# Patient Record
Sex: Female | Born: 1935 | Race: Black or African American | Hispanic: No | Smoking: Never smoker
Health system: Southern US, Community
[De-identification: ages and names within clinical notes are randomized; demographics above are authoritative.]

## PROBLEM LIST (undated history)

## (undated) DIAGNOSIS — R413 Other amnesia: Secondary | ICD-10-CM

## (undated) DIAGNOSIS — S025XXA Fracture of tooth (traumatic), initial encounter for closed fracture: Secondary | ICD-10-CM

## (undated) DIAGNOSIS — R0989 Other specified symptoms and signs involving the circulatory and respiratory systems: Secondary | ICD-10-CM

## (undated) DIAGNOSIS — N289 Disorder of kidney and ureter, unspecified: Secondary | ICD-10-CM

## (undated) DIAGNOSIS — E119 Type 2 diabetes mellitus without complications: Secondary | ICD-10-CM

## (undated) DIAGNOSIS — I219 Acute myocardial infarction, unspecified: Secondary | ICD-10-CM

## (undated) DIAGNOSIS — I10 Essential (primary) hypertension: Secondary | ICD-10-CM

## (undated) DIAGNOSIS — E7849 Other hyperlipidemia: Secondary | ICD-10-CM

## (undated) HISTORY — PX: NEPHRECTOMY: SHX65

---

## 2001-12-02 ENCOUNTER — Encounter: Payer: Self-pay | Admitting: Internal Medicine

## 2001-12-02 LAB — CONVERTED CEMR LAB

## 2005-09-06 ENCOUNTER — Emergency Department (HOSPITAL_COMMUNITY): Admission: EM | Admit: 2005-09-06 | Discharge: 2005-09-06 | Payer: Self-pay | Admitting: Family Medicine

## 2005-12-11 ENCOUNTER — Ambulatory Visit: Payer: Self-pay | Admitting: Internal Medicine

## 2006-02-03 ENCOUNTER — Emergency Department (HOSPITAL_COMMUNITY): Admission: EM | Admit: 2006-02-03 | Discharge: 2006-02-03 | Payer: Self-pay | Admitting: Family Medicine

## 2006-05-24 ENCOUNTER — Emergency Department (HOSPITAL_COMMUNITY): Admission: EM | Admit: 2006-05-24 | Discharge: 2006-05-24 | Payer: Self-pay | Admitting: Family Medicine

## 2006-08-12 ENCOUNTER — Ambulatory Visit: Payer: Self-pay | Admitting: Internal Medicine

## 2006-09-05 ENCOUNTER — Emergency Department (HOSPITAL_COMMUNITY): Admission: EM | Admit: 2006-09-05 | Discharge: 2006-09-05 | Payer: Self-pay | Admitting: Family Medicine

## 2006-11-07 ENCOUNTER — Emergency Department (HOSPITAL_COMMUNITY): Admission: EM | Admit: 2006-11-07 | Discharge: 2006-11-07 | Payer: Self-pay | Admitting: Family Medicine

## 2007-03-13 ENCOUNTER — Emergency Department (HOSPITAL_COMMUNITY): Admission: EM | Admit: 2007-03-13 | Discharge: 2007-03-13 | Payer: Self-pay | Admitting: Family Medicine

## 2007-03-15 ENCOUNTER — Emergency Department (HOSPITAL_COMMUNITY): Admission: EM | Admit: 2007-03-15 | Discharge: 2007-03-16 | Payer: Self-pay | Admitting: Emergency Medicine

## 2007-04-21 ENCOUNTER — Emergency Department (HOSPITAL_COMMUNITY): Admission: EM | Admit: 2007-04-21 | Discharge: 2007-04-21 | Payer: Self-pay | Admitting: Family Medicine

## 2007-04-23 ENCOUNTER — Ambulatory Visit: Payer: Self-pay | Admitting: Internal Medicine

## 2007-05-22 ENCOUNTER — Encounter: Payer: Self-pay | Admitting: Internal Medicine

## 2007-05-22 DIAGNOSIS — E119 Type 2 diabetes mellitus without complications: Secondary | ICD-10-CM

## 2007-05-22 DIAGNOSIS — I10 Essential (primary) hypertension: Secondary | ICD-10-CM

## 2007-05-22 DIAGNOSIS — Q6689 Other  specified congenital deformities of feet: Secondary | ICD-10-CM | POA: Insufficient documentation

## 2007-07-03 ENCOUNTER — Encounter: Payer: Self-pay | Admitting: Internal Medicine

## 2007-11-11 ENCOUNTER — Encounter: Payer: Self-pay | Admitting: Internal Medicine

## 2007-12-29 ENCOUNTER — Emergency Department (HOSPITAL_COMMUNITY): Admission: EM | Admit: 2007-12-29 | Discharge: 2007-12-29 | Payer: Self-pay | Admitting: Family Medicine

## 2008-03-06 ENCOUNTER — Emergency Department (HOSPITAL_COMMUNITY): Admission: EM | Admit: 2008-03-06 | Discharge: 2008-03-06 | Payer: Self-pay | Admitting: Family Medicine

## 2008-04-21 ENCOUNTER — Encounter: Payer: Self-pay | Admitting: Internal Medicine

## 2008-04-21 ENCOUNTER — Telehealth: Payer: Self-pay | Admitting: *Deleted

## 2008-07-02 ENCOUNTER — Emergency Department (HOSPITAL_COMMUNITY): Admission: EM | Admit: 2008-07-02 | Discharge: 2008-07-02 | Payer: Self-pay | Admitting: Family Medicine

## 2008-08-07 ENCOUNTER — Emergency Department (HOSPITAL_COMMUNITY): Admission: EM | Admit: 2008-08-07 | Discharge: 2008-08-08 | Payer: Self-pay | Admitting: Emergency Medicine

## 2010-08-27 ENCOUNTER — Emergency Department (HOSPITAL_COMMUNITY): Admission: EM | Admit: 2010-08-27 | Discharge: 2010-08-27 | Payer: Self-pay | Admitting: Emergency Medicine

## 2011-02-14 LAB — POCT I-STAT, CHEM 8
Chloride: 106 mEq/L (ref 96–112)
Creatinine, Ser: 1.2 mg/dL (ref 0.4–1.2)
HCT: 33 % — ABNORMAL LOW (ref 36.0–46.0)
Hemoglobin: 11.2 g/dL — ABNORMAL LOW (ref 12.0–15.0)
Potassium: 3.9 mEq/L (ref 3.5–5.1)
Sodium: 140 mEq/L (ref 135–145)

## 2011-02-14 LAB — URINALYSIS, ROUTINE W REFLEX MICROSCOPIC
Hgb urine dipstick: NEGATIVE
Specific Gravity, Urine: 1.012 (ref 1.005–1.030)
Urobilinogen, UA: 1 mg/dL (ref 0.0–1.0)
pH: 5 (ref 5.0–8.0)

## 2016-01-19 ENCOUNTER — Emergency Department (HOSPITAL_COMMUNITY)
Admission: EM | Admit: 2016-01-19 | Discharge: 2016-01-19 | Disposition: A | Discharge status: Home / Self Care | Payer: Medicare PPO | Attending: Emergency Medicine | Admitting: Emergency Medicine

## 2016-01-19 ENCOUNTER — Encounter (HOSPITAL_COMMUNITY): Payer: Self-pay | Admitting: Emergency Medicine

## 2016-01-19 ENCOUNTER — Emergency Department (HOSPITAL_COMMUNITY): Payer: Medicare PPO

## 2016-01-19 DIAGNOSIS — R079 Chest pain, unspecified: Secondary | ICD-10-CM | POA: Diagnosis present

## 2016-01-19 DIAGNOSIS — Z79899 Other long term (current) drug therapy: Secondary | ICD-10-CM | POA: Diagnosis not present

## 2016-01-19 DIAGNOSIS — Z880 Allergy status to penicillin: Secondary | ICD-10-CM | POA: Insufficient documentation

## 2016-01-19 DIAGNOSIS — I10 Essential (primary) hypertension: Secondary | ICD-10-CM | POA: Insufficient documentation

## 2016-01-19 DIAGNOSIS — R0789 Other chest pain: Secondary | ICD-10-CM | POA: Insufficient documentation

## 2016-01-19 DIAGNOSIS — I252 Old myocardial infarction: Secondary | ICD-10-CM | POA: Insufficient documentation

## 2016-01-19 DIAGNOSIS — E119 Type 2 diabetes mellitus without complications: Secondary | ICD-10-CM | POA: Diagnosis not present

## 2016-01-19 HISTORY — DX: Type 2 diabetes mellitus without complications: E11.9

## 2016-01-19 HISTORY — DX: Acute myocardial infarction, unspecified: I21.9

## 2016-01-19 HISTORY — DX: Disorder of kidney and ureter, unspecified: N28.9

## 2016-01-19 HISTORY — DX: Essential (primary) hypertension: I10

## 2016-01-19 LAB — BASIC METABOLIC PANEL
Anion gap: 12 (ref 5–15)
BUN: 30 mg/dL — ABNORMAL HIGH (ref 6–20)
CALCIUM: 9.2 mg/dL (ref 8.9–10.3)
CHLORIDE: 103 mmol/L (ref 101–111)
CO2: 24 mmol/L (ref 22–32)
CREATININE: 1.34 mg/dL — AB (ref 0.44–1.00)
GFR, EST AFRICAN AMERICAN: 43 mL/min — AB (ref >60)
GFR, EST NON AFRICAN AMERICAN: 37 mL/min — AB (ref >60)
Glucose, Bld: 142 mg/dL — ABNORMAL HIGH (ref 65–99)
Potassium: 3.7 mmol/L (ref 3.5–5.1)
SODIUM: 139 mmol/L (ref 135–145)

## 2016-01-19 LAB — CBC
HCT: 30.7 % — ABNORMAL LOW (ref 36.0–46.0)
Hemoglobin: 10.1 g/dL — ABNORMAL LOW (ref 12.0–15.0)
MCH: 25 pg — ABNORMAL LOW (ref 26.0–34.0)
MCHC: 32.9 g/dL (ref 30.0–36.0)
MCV: 76 fL — ABNORMAL LOW (ref 78.0–100.0)
PLATELETS: 195 10*3/uL (ref 150–400)
RBC: 4.04 MIL/uL (ref 3.87–5.11)
RDW: 15.6 % — AB (ref 11.5–15.5)
WBC: 6.3 10*3/uL (ref 4.0–10.5)

## 2016-01-19 LAB — I-STAT TROPONIN, ED
TROPONIN I, POC: 0 ng/mL (ref 0.00–0.08)
TROPONIN I, POC: 0 ng/mL (ref 0.00–0.08)

## 2016-01-19 LAB — D-DIMER, QUANTITATIVE (NOT AT ARMC): D DIMER QUANT: 3.29 ug{FEU}/mL — AB (ref 0.00–0.50)

## 2016-01-19 MED ORDER — TRAMADOL HCL 50 MG PO TABS: 50.0000 mg | ORAL_TABLET | Freq: Four times a day (QID) | ORAL | Status: DC | PRN

## 2016-01-19 MED ORDER — IOHEXOL 350 MG/ML SOLN
80.0000 mL | Freq: Once | INTRAVENOUS | Status: AC | PRN
  Administered 2016-01-19: 80 mL via INTRAVENOUS

## 2016-01-19 NOTE — ED Provider Notes (Signed)
CSN: [id number]     Arrival date & time 01/19/16  1136 History   First MD Initiated Contact with Patient 01/19/16 1517     Chief Complaint  Patient presents with  . Chest Pain     (Consider location/radiation/quality/duration/timing/severity/associated sxs/prior Treatment) HPI Dawn Curtis is a 80 y.o. female with history of hypertension, diabetes, MI with 2 stents, presents to emergency department complaining of chest pain and jaw pain. Pt states symptoms started 3 days ago. Reports she symptoms began with some jaw and gum pain which she initially attributed to recent bridge fitting. States pain continued and is intermittent, states not exertional or associated with eating. States pain has then moved into her chest. States pain is intermittent in her chest as well but it is also pleuritic. Denies SOB at rest or on exertion. Stats this feels different from her prior MI. Denies LE pain or swelling. Stats she did have some ear fullness and voice horseness as  that started around the same time. Denies any fever or chills. Denies any other complaints. 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   Past Medical History  Diagnosis Date  . Hypertension   . Diabetes mellitus without complication (HCC)   . MI (myocardial infarction) [facility])    Past Surgical History  Procedure Laterality Date  . Nephrectomy Left    No family history on file. Social History  Substance Use Topics  . Smoking status: Never Smoker   . Smokeless tobacco: None  . Alcohol Use: No   OB History    No data available     Review of Systems  Constitutional: Negative for fever and chills.  HENT: Negative for congestion and sore throat.   Respiratory: Positive for chest tightness. Negative for cough, shortness of breath and wheezing.   Cardiovascular: Positive for chest pain. Negative for palpitations and leg swelling.  Gastrointestinal: Negative for nausea,  vomiting, abdominal pain and diarrhea.  Genitourinary: Negative for dysuria, flank pain and pelvic pain.  Musculoskeletal: Positive for myalgias and arthralgias. Negative for neck pain and neck stiffness.  Skin: Negative for rash.  Neurological: Negative for dizziness, weakness and headaches.  All other systems reviewed and are negative.     Allergies  Amoxicillin and Penicillins  Home Medications   Prior to Admission medications   Medication Sig Start Date End Date Taking? Authorizing Provider  Ascorbic Acid (VITAMIN C) 1000 MG tablet Take 1,000 mg by mouth daily.   Yes Historical Provider, MD  cyanocobalamin 500 MCG tablet Take 1,000 mcg by mouth daily.   Yes Historical Provider, MD  GARLIC PO Take 3 capsules by mouth daily.   Yes Historical Provider, MD  glipiZIDE (GLUCOTROL XL) 5 MG 24 hr tablet Take 5 mg by mouth daily with breakfast.   Yes Historical Provider, MD  lisinopril-hydrochlorothiazide (PRINZIDE,ZESTORETIC) 10-12.5 MG tablet Take 1 tablet by mouth daily.   Yes Historical Provider, MD   BP 130/57 mmHg  Pulse 79  Temp(Src) 97.9 F (36.6 C)  Resp 16  SpO2 100% Physical Exam  Constitutional: She is oriented to person, place, and time. She appears well-developed and well-nourished. No distress.  HENT:  Head: Normocephalic.  Eyes: Conjunctivae are normal.  Neck: Neck supple.  Cardiovascular: Normal rate, regular rhythm and normal heart sounds.   Pulmonary/Chest: Effort normal and breath sounds normal. No respiratory distress. She has no wheezes. She has no rales. She exhibits tenderness.  Right  lower anterior chest wall tenderness with palpation  Abdominal: Soft. Bowel sounds are normal. She exhibits no distension. There is no tenderness. There is no rebound.  Musculoskeletal: She exhibits no edema.  Neurological: She is alert and oriented to person, place, and time.  Skin: Skin is warm and dry.  Psychiatric: She has a normal mood and affect. Her behavior is normal.   Nursing note and vitals reviewed.   ED Course  Procedures (including critical care time) Labs Review Labs Reviewed  BASIC METABOLIC PANEL - Abnormal; Notable for the following:    Glucose, Bld 142 (*)    BUN 30 (*)    Creatinine, Ser 1.34 (*)    GFR calc non Af Amer 37 (*)    GFR calc Af Amer 43 (*)    All other components within normal limits  CBC - Abnormal; Notable for the following:    Hemoglobin 10.1 (*)    HCT 30.7 (*)    MCV 76.0 (*)    MCH 25.0 (*)    RDW 15.6 (*)    All other components within normal limits  D-DIMER, QUANTITATIVE (NOT AT [facility]) - Abnormal; Notable for the following:    D-Dimer, Quant 3.29 (*)    All other components within normal limits  I-STAT TROPOININ, ED  Rosezena Sensor, ED    Imaging Review Dg Chest 2 View  01/19/2016  CLINICAL DATA:  Cough. EXAM: CHEST  2 VIEW COMPARISON:  March 15, 2007. FINDINGS: The heart size and mediastinal contours are within normal limits. No pneumothorax or pleural effusion is noted. Mild levoscoliosis of lower thoracic spine is noted. Left lung is clear. Mild right basilar subsegmental atelectasis or scarring is noted. IMPRESSION: Mild right basilar subsegmental atelectasis or scarring. Electronically Signed   By: Lupita Raider, M.D.   On: 01/19/2016 12:21   Ct Angio Chest Pe W/cm &/or Wo Cm  01/19/2016  CLINICAL DATA:  Pt c/o CP X 3 days which comes and goes. EXAM: CT ANGIOGRAPHY CHEST WITH CONTRAST TECHNIQUE: Multidetector CT imaging of the chest was performed using the standard protocol during bolus administration of intravenous contrast. Multiplanar CT image reconstructions and MIPs were obtained to evaluate the vascular anatomy. CONTRAST:  75 mL OMNIPAQUE IOHEXOL 350 MG/ML SOLN COMPARISON:  Current chest radiograph. FINDINGS: Angiographic study: No evidence of a pulmonary embolus. Aorta is normal in caliber. No dissection. Neck base and axilla:  No adenopathy.  No masses.  Normal thyroid. Mediastinum and hila: Heart  normal in size. Moderate coronary artery calcifications. No mediastinal or hilar masses or adenopathy. Lungs and pleura: There is bronchial wall thickening to the lower lobes and right middle lobe. There is mild associated hazy ground-glass type opacity noted right middle lobe, both lower lobes and lingula of the left upper lobe. There also linear and reticular opacities are likely. A 5 mm irregular nodule lies right upper lobe, image 70, series 5. 3 mm nodule lies laterally in the left lower lobe, image 16. There is no evidence of pulmonary edema. No pleural effusion or pneumothorax. Limited upper abdomen:  No acute finding. Musculoskeletal: A mild loss of disc height and degenerative spurring noted throughout thoracic spine. No osteoblastic or osteolytic lesions. Review of the MIP images confirms the above findings. IMPRESSION: 1. No evidence a pulmonary embolus. 2. Bronchial wall thickening associated peribronchovascular ground-glass opacity well as linear and reticular opacity, noted in the lower lungs, mostly in the lower lobes. Findings may be chronic due chronic bronchitic change and atelectasis. Acute  bronchial infection/inflammation should be considered proper clinical setting. No evidence lobar pneumonia or pulmonary edema. 3. Small lung nodules. If the patient is at high risk for bronchogenic carcinoma, follow-up chest CT at 6-12 months is recommended. If the patient is at low risk for bronchogenic carcinoma, follow-up chest CT at 12 months is recommended. This recommendation follows the consensus statement: Guidelines for Management of Small Pulmonary Nodules Detected on CT Scans: A Statement from the Fleischner Society as published in Radiology 2005;237:395-400. Electronically Signed   By: Amie Portland M.D.   On: 01/19/2016 18:16   I have personally reviewed and evaluated these images and lab results as part of my medical decision-making.   EKG Interpretation   Date/Time:  Friday January 19 2016 11:48:18 EST Ventricular Rate:  76 PR Interval:  126 QRS Duration: 94 QT Interval:  368 QTC Calculation: 414 R Axis:   78 Text Interpretation:  Normal sinus rhythm Nonspecific T wave abnormality  Abnormal ECG No significant change since last tracing Confirmed by KNAPP   MD-J, JON ([id number]) on 01/19/2016 4:28:39 PM      MDM   Final diagnoses:  Chest pain, unspecified chest pain type    Patient with right-sided chest pain that is pleuritic and reproducible with palpation. Patient does have history of MI, 2-1/2 years ago, with stenting but states this feels very different. Her pain is nonexertional, she denies any shortness of breath, denies any swelling in extremities. Will get labs including troponin, d-dimer, vital signs are normal at this time, patient is pain-free unless she takes a deep breath or pushes on her chest.  D-dimer elevated at 3.29. CT angiogram is ordered.  6:29 PM Pt's two sets of troponin are 0.00. ECG with no acute changes. Doubt ACS. CT imaging shows no evidence of pulmonary embolism. He does show bronchial wall thickening with peribronchial vascular ground glass opacity, suggestive of possible chronic bronchitis and atelectasis. I do not think these findings are acute, patient is afebrile, she denies any productive cough. Also small lung nodules noted. Discussed these results with patient. Will treat pain with Tylenol and tramadol, follow-up with primary care doctor for recheck of further evaluation. Return precautions discussed  Filed Vitals:   01/19/16 1149 01/19/16 1548 01/19/16 1600 01/19/16 1630  BP: 136/70 130/57 134/75 121/57  Pulse: 76 79 70 70  Temp: 97.9 F (36.6 C)     Resp: 18 16 14 23   SpO2: 100% 100% 97% 98%        Jaynie Crumble, PA-C 01/19/16 1832  Linwood Dibbles, MD 01/20/16 1616

## 2016-01-19 NOTE — ED Notes (Signed)
Patient transported to CT 

## 2016-01-19 NOTE — ED Notes (Signed)
Pt reports CP x 3 days which comes and goes. Pt alert x4. NAD at this time.

## 2016-01-19 NOTE — Discharge Instructions (Signed)
Take tylenol for pain. Tramadol for severe pain. Your blood work and imaging results did not show any evidence of blood clots or heart attack. Your CT scan did show few nodules and scarring in your lungs, please follow up with primary care doctor for recheck. Return if worsening symptoms.    Chest Wall Pain Chest wall pain is pain in or around the bones and muscles of your chest. Sometimes, an injury causes this pain. Sometimes, the cause may not be known. This pain may take several weeks or longer to get better. HOME CARE INSTRUCTIONS  Pay attention to any changes in your symptoms. Take these actions to help with your pain:   Rest as told by your health care provider.   Avoid activities that cause pain. These include any activities that use your chest muscles or your abdominal and side muscles to lift heavy items.   If directed, apply ice to the painful area:  Put ice in a plastic bag.  Place a towel between your skin and the bag.  Leave the ice on for 20 minutes, 2-3 times per day.  Take over-the-counter and prescription medicines only as told by your health care provider.  Do not use tobacco products, including cigarettes, chewing tobacco, and e-cigarettes. If you need help quitting, ask your health care provider.  Keep all follow-up visits as told by your health care provider. This is important. SEEK MEDICAL CARE IF:  You have a fever.  Your chest pain becomes worse.  You have new symptoms. SEEK IMMEDIATE MEDICAL CARE IF:  You have nausea or vomiting.  You feel sweaty or light-headed.  You have a cough with phlegm (sputum) or you cough up blood.  You develop shortness of breath.   This information is not intended to replace advice given to you by your health care provider. Make sure you discuss any questions you have with your health care provider.   Document Released: 11/18/2005 Document Revised: 08/09/2015 Document Reviewed: 02/13/2015 Elsevier Interactive  Patient Education [employer]

## 2016-02-28 ENCOUNTER — Encounter (HOSPITAL_COMMUNITY): Payer: Self-pay | Admitting: *Deleted

## 2016-02-28 ENCOUNTER — Emergency Department (INDEPENDENT_AMBULATORY_CARE_PROVIDER_SITE_OTHER)
Admission: EM | Admit: 2016-02-28 | Discharge: 2016-02-28 | Disposition: A | Discharge status: Home / Self Care | Payer: Medicare PPO | Source: Home / Self Care | Attending: Family Medicine | Admitting: Family Medicine

## 2016-02-28 ENCOUNTER — Encounter (HOSPITAL_COMMUNITY): Payer: Self-pay | Admitting: Emergency Medicine

## 2016-02-28 ENCOUNTER — Emergency Department (HOSPITAL_COMMUNITY)
Admission: EM | Admit: 2016-02-28 | Discharge: 2016-02-28 | Disposition: A | Discharge status: Home / Self Care | Payer: Medicare PPO | Attending: Emergency Medicine | Admitting: Emergency Medicine

## 2016-02-28 DIAGNOSIS — I10 Essential (primary) hypertension: Secondary | ICD-10-CM | POA: Insufficient documentation

## 2016-02-28 DIAGNOSIS — H538 Other visual disturbances: Secondary | ICD-10-CM | POA: Diagnosis not present

## 2016-02-28 DIAGNOSIS — H5711 Ocular pain, right eye: Secondary | ICD-10-CM | POA: Insufficient documentation

## 2016-02-28 DIAGNOSIS — E119 Type 2 diabetes mellitus without complications: Secondary | ICD-10-CM | POA: Diagnosis not present

## 2016-02-28 DIAGNOSIS — I252 Old myocardial infarction: Secondary | ICD-10-CM | POA: Insufficient documentation

## 2016-02-28 NOTE — ED Notes (Signed)
Visual  Acuity      20/100   r     20/100  l     20-/100  Both

## 2016-02-28 NOTE — ED Provider Notes (Signed)
CSN: [id number]     Arrival date & time 02/28/16  1551 History   First MD Initiated Contact with Patient 02/28/16 1715     Chief Complaint  Patient presents with  . Eye Problem   (Consider location/radiation/quality/duration/timing/severity/associated sxs/prior Treatment) Patient is a 80 y.o. female presenting with eye problem. The history is provided by the patient.  Eye Problem Location:  R eye Quality:  Tearing Severity:  Moderate Onset quality:  Gradual Duration:  1 week Progression:  Worsening Chronicity:  New Context comment:  Pain and decreased vision right eye onset today. Associated symptoms: redness     Past Medical History  Diagnosis Date  . Hypertension   . Diabetes mellitus without complication (HCC)   . MI (myocardial infarction) (HCC)   . Renal insufficiency    Past Surgical History  Procedure Laterality Date  . Nephrectomy Left    History reviewed. No pertinent family history. Social History  Substance Use Topics  . Smoking status: Never Smoker   . Smokeless tobacco: None  . Alcohol Use: No   OB History    No data available     Review of Systems  Constitutional: Negative.   Eyes: Positive for pain, redness and visual disturbance.  All other systems reviewed and are negative.   Allergies  Amoxicillin and Penicillins  Home Medications   Prior to Admission medications   Medication Sig Start Date End Date Taking? Authorizing Provider  Ascorbic Acid (VITAMIN C) 1000 MG tablet Take 1,000 mg by mouth daily.    Historical Provider, MD  cyanocobalamin 500 MCG tablet Take 1,000 mcg by mouth daily.    Historical Provider, MD  GARLIC PO Take 3 capsules by mouth daily.    Historical Provider, MD  glipiZIDE (GLUCOTROL XL) 5 MG 24 hr tablet Take 5 mg by mouth daily with breakfast.    Historical Provider, MD  lisinopril-hydrochlorothiazide (PRINZIDE,ZESTORETIC) 10-12.5 MG tablet Take 1 tablet by mouth daily.    Historical Provider, MD  traMADol (ULTRAM) 50  MG tablet Take 1 tablet (50 mg total) by mouth every 6 (six) hours as needed. 01/19/16   Tatyana Kirichenko, PA-C   Meds Ordered and Administered this Visit  Medications - No data to display  BP 133/52 mmHg  Pulse 58  Temp(Src) 98.1 F (36.7 C) (Oral)  Resp 18  SpO2 100% No data found.   Physical Exam  Constitutional: She appears well-developed and well-nourished. She appears distressed.  Eyes: EOM are normal. Pupils are equal, round, and reactive to light. Right eye exhibits no discharge. Left eye exhibits no discharge. Right conjunctiva has no hemorrhage. Left conjunctiva has no hemorrhage.    Nursing note and vitals reviewed.   ED Course  Procedures (including critical care time)  Labs Review Labs Reviewed - No data to display  Imaging Review No results found.   Visual Acuity Review  Right Eye Distance:   Left Eye Distance:   Bilateral Distance:    Right Eye Near:   Left Eye Near:    Bilateral Near:         MDM   1. Eye pain, right        Linna HoffJames D Alayjah Boehringer, MD 02/28/16 [phone]

## 2016-02-28 NOTE — ED Notes (Signed)
Pt sts right eye pain and blurred vision upon waking this am

## 2016-02-28 NOTE — ED Notes (Signed)
Pt  Had  Eye      Had  Tearing     And  Irritation   X  1  Week     -  Pt  Has  Pain     In the  Affected  Today  The  Eye  Is  Red        r  Eye  Feels gritty

## 2016-02-28 NOTE — Discharge Instructions (Signed)
Call eye doctor in am for further eval. Report that we have spoken with doctor groat for you to be seen.

## 2016-05-22 ENCOUNTER — Ambulatory Visit (HOSPITAL_COMMUNITY)
Admission: EM | Admit: 2016-05-22 | Discharge: 2016-05-22 | Disposition: A | Discharge status: Home / Self Care | Payer: Medicare PPO | Attending: Emergency Medicine | Admitting: Emergency Medicine

## 2016-05-22 ENCOUNTER — Encounter (HOSPITAL_COMMUNITY): Payer: Self-pay | Admitting: Emergency Medicine

## 2016-05-22 DIAGNOSIS — H6122 Impacted cerumen, left ear: Secondary | ICD-10-CM

## 2016-05-22 DIAGNOSIS — D649 Anemia, unspecified: Secondary | ICD-10-CM

## 2016-05-22 DIAGNOSIS — I10 Essential (primary) hypertension: Secondary | ICD-10-CM | POA: Diagnosis not present

## 2016-05-22 DIAGNOSIS — J040 Acute laryngitis: Secondary | ICD-10-CM

## 2016-05-22 DIAGNOSIS — B9789 Other viral agents as the cause of diseases classified elsewhere: Secondary | ICD-10-CM

## 2016-05-22 LAB — POCT I-STAT, CHEM 8
BUN: 25 mg/dL — ABNORMAL HIGH (ref 6–20)
CHLORIDE: 103 mmol/L (ref 101–111)
Calcium, Ion: 1.14 mmol/L (ref 1.13–1.30)
Creatinine, Ser: 1.4 mg/dL — ABNORMAL HIGH (ref 0.44–1.00)
Glucose, Bld: 92 mg/dL (ref 65–99)
HCT: 28 % — ABNORMAL LOW (ref 36.0–46.0)
Hemoglobin: 9.5 g/dL — ABNORMAL LOW (ref 12.0–15.0)
POTASSIUM: 3.7 mmol/L (ref 3.5–5.1)
SODIUM: 139 mmol/L (ref 135–145)
TCO2: 24 mmol/L (ref 0–100)

## 2016-05-22 MED ORDER — AMLODIPINE BESYLATE 5 MG PO TABS: 5.0000 mg | ORAL_TABLET | Freq: Every day | ORAL | Status: DC

## 2016-05-22 MED ORDER — PREDNISONE 20 MG PO TABS: 20.0000 mg | ORAL_TABLET | Freq: Every day | ORAL | Status: DC

## 2016-05-22 NOTE — ED Notes (Signed)
[facility] with a complaint of bilateral ear pain with laryngitis x 5 days. The patient also requested a refill on her HTN med lisinopril.

## 2016-05-22 NOTE — ED Provider Notes (Signed)
CSN: [id number]     Arrival date & time 05/22/16  1830 History   First MD Initiated Contact with Patient 05/22/16 1921     Chief Complaint  Patient presents with  . Otalgia  . Medication Refill   (Consider location/radiation/quality/duration/timing/severity/associated sxs/prior Treatment) HPI  She is a 80 year old woman here for evaluation of ear pain and medication refill. She states she's been sick with a cold for about 5 days. She reports sneezing, runny nose, mild cough, laryngitis. These have mostly improved, but she is now experiencing bilateral ear discomfort. She reports decreased hearing in the left ear. She did get some mucus up with coughing earlier today. No documented fevers. No nausea or vomiting. She denies any shortness of breath or wheezing.  She is also requesting a refill of her lisinopril-HCTZ. She's been out of this medicine for the last 5 days. [place] has been in [place] 3 months, and has not yet established a PCP.  Past Medical History  Diagnosis Date  . Hypertension   . Diabetes mellitus without complication (HCC)   . MI (myocardial infarction) (HCC)   . Renal insufficiency    Past Surgical History  Procedure Laterality Date  . Nephrectomy Left    History reviewed. No pertinent family history. Social History  Substance Use Topics  . Smoking status: Never Smoker   . Smokeless tobacco: None  . Alcohol Use: No   OB History    No data available     Review of Systems As in history of present illness Allergies  Amoxicillin and Penicillins  Home Medications   Prior to Admission medications   Medication Sig Start Date End Date Taking? Authorizing Provider  amLODipine (NORVASC) 5 MG tablet Take 1 tablet (5 mg total) by mouth daily. 05/22/16   Charm RingsErin J Honig, MD  Ascorbic Acid (VITAMIN C) 1000 MG tablet Take 1,000 mg by mouth daily.    Historical Provider, MD  cyanocobalamin 500 MCG tablet Take 1,000 mcg by mouth daily.    Historical Provider,  MD  GARLIC PO Take 3 capsules by mouth daily.    Historical Provider, MD  glipiZIDE (GLUCOTROL XL) 5 MG 24 hr tablet Take 5 mg by mouth daily with breakfast.    Historical Provider, MD  predniSONE (DELTASONE) 20 MG tablet Take 1 tablet (20 mg total) by mouth daily with breakfast. 05/22/16   Charm RingsErin J Honig, MD  traMADol (ULTRAM) 50 MG tablet Take 1 tablet (50 mg total) by mouth every 6 (six) hours as needed. 01/19/16   Tatyana Kirichenko, PA-C   Meds Ordered and Administered this Visit  Medications - No data to display  BP 163/63 mmHg  Pulse 66  Temp(Src) 98.3 F (36.8 C) (Oral)  Resp 16  SpO2 100% No data found.   Physical Exam  Constitutional: She is oriented to person, place, and time. She appears well-developed and well-nourished. No distress.  HENT:  Mouth/Throat: Oropharynx is clear and moist. No oropharyngeal exudate.  Small amount of nasal discharge present. No erythema of the nasal mucosa, but slight swelling. Right TM is normal. Left TM obscured by earwax.  Neck: Neck supple.  Cardiovascular: Normal rate, regular rhythm and normal heart sounds.   No murmur heard. Pulmonary/Chest: Effort normal and breath sounds normal. No respiratory distress. She has no wheezes. She has no rales.  Lymphadenopathy:    She has no cervical adenopathy.  Neurological: She is alert and oriented to person, place, and time.    ED Course  Procedures (including critical  care time)  Labs Review Labs Reviewed  POCT I-STAT, CHEM 8 - Abnormal; Notable for the following:    BUN 25 (*)    Creatinine, Ser 1.40 (*)    Hemoglobin 9.5 (*)    HCT 28.0 (*)    All other components within normal limits    Imaging Review No results found.    MDM   1. Viral laryngitis   2. Essential hypertension   3. Anemia, unspecified anemia type    Left TM is normal after ear wax removal.  Short course of low-dose prednisone to help with laryngitis and ear discomfort. Given her creatinine, will change her  blood pressure medication to amlodipine 5 mg daily. I did also discuss that she has anemia. I recommended that she start an over-the-counter iron pill. She does not know if she has had a colonoscopy. She will continue to work on finding a PCP here. Her anemia does need to be worked up additionally. Follow up as needed.    Charm Rings, MD 05/22/16 2010

## 2016-05-22 NOTE — Discharge Instructions (Signed)
The ear discomfort and laryngitis are coming from a virus. Take prednisone daily for 5 days. This will help relieve the inflammation and help you feel better sooner. You can use a drop of olive oil in each ear at bedtime to help with wax buildup.  Start taking amlodipine 5 mg daily. If your blood pressures are still elevated after a week of taking this medicine, please come back.  Please get an over-the-counter iron supplement. This is typically called ferrous sulfate. Take 1 pill daily.  Keep working on finding a primary care doctor [place] in [place].

## 2016-07-01 ENCOUNTER — Emergency Department (HOSPITAL_COMMUNITY): Payer: Medicare PPO

## 2016-07-01 ENCOUNTER — Emergency Department (HOSPITAL_COMMUNITY)
Admission: EM | Admit: 2016-07-01 | Discharge: 2016-07-01 | Disposition: A | Discharge status: Home / Self Care | Payer: Medicare PPO | Attending: Emergency Medicine | Admitting: Emergency Medicine

## 2016-07-01 ENCOUNTER — Encounter (HOSPITAL_COMMUNITY): Payer: Self-pay | Admitting: Emergency Medicine

## 2016-07-01 DIAGNOSIS — Z7984 Long term (current) use of oral hypoglycemic drugs: Secondary | ICD-10-CM | POA: Insufficient documentation

## 2016-07-01 DIAGNOSIS — I10 Essential (primary) hypertension: Secondary | ICD-10-CM | POA: Diagnosis not present

## 2016-07-01 DIAGNOSIS — I252 Old myocardial infarction: Secondary | ICD-10-CM | POA: Insufficient documentation

## 2016-07-01 DIAGNOSIS — Z79899 Other long term (current) drug therapy: Secondary | ICD-10-CM | POA: Diagnosis not present

## 2016-07-01 DIAGNOSIS — R42 Dizziness and giddiness: Secondary | ICD-10-CM | POA: Diagnosis not present

## 2016-07-01 DIAGNOSIS — E119 Type 2 diabetes mellitus without complications: Secondary | ICD-10-CM | POA: Insufficient documentation

## 2016-07-01 LAB — CBC WITH DIFFERENTIAL/PLATELET
BASOS ABS: 0 10*3/uL (ref 0.0–0.1)
Basophils Relative: 0 %
Eosinophils Absolute: 0.2 10*3/uL (ref 0.0–0.7)
Eosinophils Relative: 4 %
HEMATOCRIT: 30.9 % — AB (ref 36.0–46.0)
Hemoglobin: 10.1 g/dL — ABNORMAL LOW (ref 12.0–15.0)
LYMPHS ABS: 0.9 10*3/uL (ref 0.7–4.0)
LYMPHS PCT: 17 %
MCH: 25.6 pg — AB (ref 26.0–34.0)
MCHC: 32.7 g/dL (ref 30.0–36.0)
MCV: 78.4 fL (ref 78.0–100.0)
Monocytes Absolute: 0.3 10*3/uL (ref 0.1–1.0)
Monocytes Relative: 6 %
NEUTROS ABS: 3.9 10*3/uL (ref 1.7–7.7)
Neutrophils Relative %: 73 %
Platelets: 212 10*3/uL (ref 150–400)
RBC: 3.94 MIL/uL (ref 3.87–5.11)
RDW: 14.1 % (ref 11.5–15.5)
WBC: 5.3 10*3/uL (ref 4.0–10.5)

## 2016-07-01 LAB — COMPREHENSIVE METABOLIC PANEL
ALT: 12 U/L — AB (ref 14–54)
AST: 18 U/L (ref 15–41)
Albumin: 3.6 g/dL (ref 3.5–5.0)
Alkaline Phosphatase: 92 U/L (ref 38–126)
Anion gap: 5 (ref 5–15)
BUN: 15 mg/dL (ref 6–20)
CO2: 24 mmol/L (ref 22–32)
CREATININE: 1.14 mg/dL — AB (ref 0.44–1.00)
Calcium: 9.3 mg/dL (ref 8.9–10.3)
Chloride: 108 mmol/L (ref 101–111)
GFR, EST AFRICAN AMERICAN: 52 mL/min — AB (ref >60)
GFR, EST NON AFRICAN AMERICAN: 45 mL/min — AB (ref >60)
Glucose, Bld: 118 mg/dL — ABNORMAL HIGH (ref 65–99)
Potassium: 3.6 mmol/L (ref 3.5–5.1)
Sodium: 137 mmol/L (ref 135–145)
TOTAL PROTEIN: 7.3 g/dL (ref 6.5–8.1)
Total Bilirubin: 0.7 mg/dL (ref 0.3–1.2)

## 2016-07-01 LAB — MAGNESIUM: MAGNESIUM: 1.8 mg/dL (ref 1.7–2.4)

## 2016-07-01 MED ORDER — LISINOPRIL-HYDROCHLOROTHIAZIDE 10-12.5 MG PO TABS: 1.0000 | ORAL_TABLET | Freq: Every day | ORAL | 0 refills | Status: DC

## 2016-07-01 MED ORDER — OXYCODONE-ACETAMINOPHEN 5-325 MG PO TABS
ORAL_TABLET | ORAL | Status: AC
  Filled 2016-07-01: qty 1

## 2016-07-01 NOTE — ED Notes (Signed)
Patient transported to CT 

## 2016-07-01 NOTE — ED Triage Notes (Signed)
Drove self to ED-- had an episode of dizziness started this am approx 30 minutes ago-- has had a neck pain and head pain started Saturday. H/a -- squeezing both sides of head. States bp was high last night

## 2016-07-01 NOTE — ED Notes (Signed)
Phlebotomy at bedside.

## 2016-07-01 NOTE — ED Provider Notes (Signed)
[facility] Provider Note   CSN: [id number] Arrival date & time: 07/01/16  4098  First Provider Contact:  None       History   Chief Complaint Chief Complaint  Patient presents with  . Dizziness    HPI Dawn Curtis is a 80 y.o. female.  HPI   Episode of dizziness this AM while riding in the car.  Lasted less than 1 minutes, associated with sensation of world spinning, but also describes as lightheaded. No focal weakness/numbness. No hx of similar.  For last several days has had moderate posterior headache which is new, also located bilateral temples. No falls, slow onset.  and blood pressures higher--usually 130s, now 160s-180s/80s. Feels like pressures have been higher since taking amlodipine. Was on lisinopril-hctz for years without problems, howver at urgent care was switched to amlodipine. Took 1.5 amlodipine tablets today given elevated bp.  No current weakness, no recurrence of dizziness. NO changes vision/hearing/diff speaking. No recent illness.  Feeling gen weakness/bilat leg weakness. No CP/SOB/diarrhea/vomiting/black or blood y stool.   Past Medical History:  Diagnosis Date  . Diabetes mellitus without complication (HCC)   . Hypertension   . MI (myocardial infarction) (HCC)   . Renal insufficiency     Patient Active Problem List   Diagnosis Date Noted  . DIABETES MELLITUS, TYPE II 05/22/2007  . HYPERTENSION 05/22/2007  . DEFORMITY, FOOT NEC, CONGENITAL 05/22/2007    Past Surgical History:  Procedure Laterality Date  . NEPHRECTOMY Left     OB History    No data available       Home Medications    Prior to Admission medications   Medication Sig Start Date End Date Taking? Authorizing Provider  Ascorbic Acid (VITAMIN C) 1000 MG tablet Take 1,000 mg by mouth daily.   Yes Historical Provider, MD  cyanocobalamin 500 MCG tablet Take 1,000 mcg by mouth daily.   Yes Historical Provider, MD  GARLIC PO Take 3 capsules by mouth daily.   Yes  Historical Provider, MD  L-Glutamine 500 MG CAPS Take 500 mg by mouth daily.   Yes Historical Provider, MD  glipiZIDE (GLUCOTROL XL) 5 MG 24 hr tablet Take 5 mg by mouth daily with breakfast.    Historical Provider, MD  lisinopril-hydrochlorothiazide (PRINZIDE,ZESTORETIC) 10-12.5 MG tablet Take 1 tablet by mouth daily. 07/01/16   [name], MD    Family History No family history on file.  Social History Social History  Substance Use Topics  . Smoking status: Never Smoker  . Smokeless tobacco: Never Used  . Alcohol use No     Allergies   Amoxicillin and Penicillins   Review of Systems Review of Systems  Constitutional: Negative for fever.  HENT: Negative for sore throat.   Eyes: Negative for visual disturbance.  Respiratory: Negative for cough and shortness of breath.   Cardiovascular: Negative for chest pain.  Gastrointestinal: Negative for abdominal pain.  Genitourinary: Negative for difficulty urinating.  Musculoskeletal: Negative for back pain and neck pain.  Skin: Negative for rash.  Neurological: Positive for dizziness and headaches. Negative for syncope, facial asymmetry, speech difficulty, weakness, light-headedness and numbness.     Physical Exam Updated Vital Signs BP 146/63   Pulse (!) 54   Temp 97.8 F (36.6 C) (Oral)   Resp 16   Ht 5\' 5"  (1.651 m)   Wt 133 lb 8 oz (60.6 kg)   SpO2 100%   BMI 22.22 kg/m   Physical Exam  Constitutional: She is oriented to person,  place, and time. She appears well-developed and well-nourished. No distress.  HENT:  Head: Normocephalic and atraumatic.  Mouth/Throat: No oropharyngeal exudate.  Eyes: Conjunctivae and EOM are normal. Pupils are equal, round, and reactive to light.  Neck: Normal range of motion.  Cardiovascular: Normal rate, regular rhythm, normal heart sounds and intact distal pulses.  Exam reveals no gallop and no friction rub.   No murmur heard. Pulmonary/Chest: Effort normal and breath sounds  normal. No respiratory distress. She has no wheezes. She has no rales.  Abdominal: Soft. She exhibits no distension. There is no tenderness. There is no guarding.  Musculoskeletal: She exhibits no edema or tenderness.  Neurological: She is alert and oriented to person, place, and time. She has normal strength. She displays no tremor. No cranial nerve deficit or sensory deficit. Coordination and gait normal. GCS eye subscore is 4. GCS verbal subscore is 5. GCS motor subscore is 6.  Skin: Skin is warm and dry. No rash noted. She is not diaphoretic. No erythema.  Nursing note and vitals reviewed.    ED Treatments / Results  Labs (all labs ordered are listed, but only abnormal results are displayed) Labs Reviewed  CBC WITH DIFFERENTIAL/PLATELET - Abnormal; Notable for the following:       Result Value   Hemoglobin 10.1 (*)    HCT 30.9 (*)    MCH 25.6 (*)    All other components within normal limits  COMPREHENSIVE METABOLIC PANEL - Abnormal; Notable for the following:    Glucose, Bld 118 (*)    Creatinine, Ser 1.14 (*)    ALT 12 (*)    GFR calc non Af Amer 45 (*)    GFR calc Af Amer 52 (*)    All other components within normal limits  MAGNESIUM    EKG  EKG Interpretation None       Radiology Ct Head Wo Contrast  Result Date: 07/01/2016 CLINICAL DATA:  Headache, dizziness, hypertension. EXAM: CT HEAD WITHOUT CONTRAST TECHNIQUE: Contiguous axial images were obtained from the base of the skull through the vertex without intravenous contrast. COMPARISON:  None. FINDINGS: Brain parenchyma, ventricular system, and extra-axial space are within normal limits. No mass effect, midline shift, or acute hemorrhage. Cranium is intact. IMPRESSION: No acute intracranial pathology. Electronically Signed   By: Jolaine Click M.D.   On: 07/01/2016 11:36   Procedures Procedures (including critical care time)  Medications Ordered in ED Medications - No data to display   Initial Impression /  Assessment and Plan / ED Course  I have reviewed the triage vital signs and the nursing notes.  Pertinent labs & imaging results that were available during my care of the patient were reviewed by me and considered in my medical decision making (see chart for details).  Clinical Course   80yo female with history of DM, htn, MI, nephrectomy, presents with concern for 1 minute episode of dizziness.  Differential diagnosis for dizziness includes central causes such as stroke, intracranial bleed, mass and peripheral causes such as BPPV, meniere's disease, viral.  By history, not clear vertigo, and given duration with no other neurologic abnormalities, doubt TIA or CVA.  No CP/SOB, no sign of tachyarrhythmia in the ED.  Labs WNL.  Patient with headache which she reports is new, and screening head CT negative. Hx not consistent with SAH.  Pt reports better control of her blood pressures with lisinopril-hctz than with current amlodipine.  Had been on the medication for years prior to medication being held  due to hx of nephrectomy.  Given pt had been on this medication for a long time, and feel lisinopril will likely benefit her in setting of DM, will restart this medication and dc amlodipine.  Discussed reasons to return to ED in detail. Recommend close PCP follow up, potential outpt holter monitoring.   Final Clinical Impressions(s) / ED Diagnoses   Final diagnoses:  Dizziness  Essential hypertension    New Prescriptions Discharge Medication List as of 07/01/2016  2:00 PM    START taking these medications   Details  lisinopril-hydrochlorothiazide (PRINZIDE,ZESTORETIC) 10-12.5 MG tablet Take 1 tablet by mouth daily., Starting Mon 07/01/2016, Print         [name], MD 07/01/16 [phone]

## 2016-08-03 ENCOUNTER — Ambulatory Visit (HOSPITAL_COMMUNITY)
Admission: EM | Admit: 2016-08-03 | Discharge: 2016-08-03 | Disposition: A | Discharge status: Home / Self Care | Payer: Medicare PPO | Attending: Family Medicine | Admitting: Family Medicine

## 2016-08-03 ENCOUNTER — Encounter (HOSPITAL_COMMUNITY): Payer: Self-pay | Admitting: *Deleted

## 2016-08-03 DIAGNOSIS — S9031XA Contusion of right foot, initial encounter: Secondary | ICD-10-CM | POA: Diagnosis not present

## 2016-08-03 DIAGNOSIS — I10 Essential (primary) hypertension: Secondary | ICD-10-CM | POA: Diagnosis not present

## 2016-08-03 MED ORDER — LISINOPRIL-HYDROCHLOROTHIAZIDE 10-12.5 MG PO TABS: 1.0000 | ORAL_TABLET | Freq: Every day | ORAL | 1 refills | Status: DC

## 2016-08-03 NOTE — ED Triage Notes (Signed)
Pt  Reports  Pain  r  Foot  She  Reports   She  Hit  It  On  A  Table  Last  Pm      She  Also  States  She  lostt her  Lisinopril   Bottle    And  Is  Requesting a  Refill   She   States  She  Is  Not taking  Her  Diabetic  meds      Also

## 2016-08-03 NOTE — ED Provider Notes (Signed)
[facility]    CSN: [id number] Arrival date & time: 08/03/16  1335  First Provider Contact:  First MD Initiated Contact with Patient 08/03/16 1443        History   Chief Complaint Chief Complaint  Patient presents with  . Foot Pain    HPI Dawn Curtis is a 80 y.o. female.   The history is provided by the patient.  Foot Injury  Location:  Toe Time since incident:  12 hours Toe location:  R great toe Pain details:    Severity:  Mild   Onset quality:  Sudden (struck against table during night)   Progression:  Unchanged Chronicity:  New Dislocation: no   Foreign body present:  No foreign bodies Prior injury to area:  No Relieved by:  None tried Worsened by:  Bearing weight   Past Medical History:  Diagnosis Date  . Diabetes mellitus without complication (HCC)   . Hypertension   . MI (myocardial infarction) (HCC)   . Renal insufficiency     Patient Active Problem List   Diagnosis Date Noted  . DIABETES MELLITUS, TYPE II 05/22/2007  . HYPERTENSION 05/22/2007  . DEFORMITY, FOOT NEC, CONGENITAL 05/22/2007    Past Surgical History:  Procedure Laterality Date  . NEPHRECTOMY Left     OB History    No data available       Home Medications    Prior to Admission medications   Medication Sig Start Date End Date Taking? Authorizing Provider  lisinopril-hydrochlorothiazide (PRINZIDE,ZESTORETIC) 10-12.5 MG tablet Take 1 tablet by mouth daily. 07/01/16  Yes Alvira MondayErin Schlossman, MD  Ascorbic Acid (VITAMIN C) 1000 MG tablet Take 1,000 mg by mouth daily.    Historical Provider, MD  cyanocobalamin 500 MCG tablet Take 1,000 mcg by mouth daily.    Historical Provider, MD  GARLIC PO Take 3 capsules by mouth daily.    Historical Provider, MD  glipiZIDE (GLUCOTROL XL) 5 MG 24 hr tablet Take 5 mg by mouth daily with breakfast.    Historical Provider, MD  L-Glutamine 500 MG CAPS Take 500 mg by mouth daily.    Historical Provider, MD    Family History History  reviewed. No pertinent family history.  Social History Social History  Substance Use Topics  . Smoking status: Never Smoker  . Smokeless tobacco: Never Used  . Alcohol use No     Allergies   Amoxicillin and Penicillins   Review of Systems Review of Systems  Respiratory: Negative.   Cardiovascular: Negative.   Musculoskeletal: Positive for gait problem. Negative for joint swelling.  Skin: Negative.   All other systems reviewed and are negative.    Physical Exam Triage Vital Signs ED Triage Vitals  Enc Vitals Group     BP 08/03/16 1343 (!) 142/50     Pulse Rate 08/03/16 1343 68     Resp --      Temp 08/03/16 1343 98.3 F (36.8 C)     Temp Source 08/03/16 1343 Oral     SpO2 08/03/16 1343 100 %     Weight --      Height --      Head Circumference --      Peak Flow --      Pain Score 08/03/16 1422 5     Pain Loc --      Pain Edu? --      Excl. in GC? --    No data found.   Updated Vital Signs BP (!) 142/50 (  BP Location: Left Arm) Comment: notified rn  Pulse 68   Temp 98.3 F (36.8 C) (Oral)   SpO2 100%   Visual Acuity Right Eye Distance:   Left Eye Distance:   Bilateral Distance:    Right Eye Near:   Left Eye Near:    Bilateral Near:     Physical Exam  Constitutional: She is oriented to person, place, and time. She appears well-developed and well-nourished. No distress.  Cardiovascular: Normal rate.   Pulmonary/Chest: Effort normal.  Abdominal: Soft. Bowel sounds are normal.  Musculoskeletal: Normal range of motion. She exhibits tenderness. She exhibits no edema or deformity.  Neurological: She is alert and oriented to person, place, and time.  Skin: Skin is warm and dry.  Nursing note and vitals reviewed.    UC Treatments / Results  Labs (all labs ordered are listed, but only abnormal results are displayed) Labs Reviewed - No data to display  EKG  EKG Interpretation None       Radiology No results found.  Procedures Procedures  (including critical care time)  Medications Ordered in UC Medications - No data to display   Initial Impression / Assessment and Plan / UC Course  I have reviewed the triage vital signs and the nursing notes.  Pertinent labs & imaging results that were available during my care of the patient were reviewed by me and considered in my medical decision making (see chart for details).  Clinical Course      Final Clinical Impressions(s) / UC Diagnoses   Final diagnoses:  None    New Prescriptions New Prescriptions   No medications on file     Linna Hoff, MD 08/03/16 1450

## 2016-08-07 ENCOUNTER — Emergency Department (HOSPITAL_COMMUNITY)
Admission: EM | Admit: 2016-08-07 | Discharge: 2016-08-08 | Disposition: A | Discharge status: Home / Self Care | Payer: Medicare PPO | Attending: Emergency Medicine | Admitting: Emergency Medicine

## 2016-08-07 ENCOUNTER — Encounter (HOSPITAL_COMMUNITY): Payer: Self-pay | Admitting: Emergency Medicine

## 2016-08-07 DIAGNOSIS — E119 Type 2 diabetes mellitus without complications: Secondary | ICD-10-CM | POA: Insufficient documentation

## 2016-08-07 DIAGNOSIS — R112 Nausea with vomiting, unspecified: Secondary | ICD-10-CM

## 2016-08-07 DIAGNOSIS — I10 Essential (primary) hypertension: Secondary | ICD-10-CM | POA: Diagnosis not present

## 2016-08-07 DIAGNOSIS — Z79899 Other long term (current) drug therapy: Secondary | ICD-10-CM | POA: Insufficient documentation

## 2016-08-07 DIAGNOSIS — R109 Unspecified abdominal pain: Secondary | ICD-10-CM | POA: Diagnosis not present

## 2016-08-07 DIAGNOSIS — K5900 Constipation, unspecified: Secondary | ICD-10-CM | POA: Insufficient documentation

## 2016-08-07 DIAGNOSIS — M542 Cervicalgia: Secondary | ICD-10-CM | POA: Diagnosis not present

## 2016-08-07 DIAGNOSIS — R197 Diarrhea, unspecified: Secondary | ICD-10-CM | POA: Diagnosis present

## 2016-08-07 LAB — CBC
HEMATOCRIT: 30.2 % — AB (ref 36.0–46.0)
Hemoglobin: 10.2 g/dL — ABNORMAL LOW (ref 12.0–15.0)
MCH: 25.5 pg — ABNORMAL LOW (ref 26.0–34.0)
MCHC: 33.8 g/dL (ref 30.0–36.0)
MCV: 75.5 fL — ABNORMAL LOW (ref 78.0–100.0)
PLATELETS: 210 10*3/uL (ref 150–400)
RBC: 4 MIL/uL (ref 3.87–5.11)
RDW: 13.8 % (ref 11.5–15.5)
WBC: 7.9 10*3/uL (ref 4.0–10.5)

## 2016-08-07 NOTE — ED Notes (Signed)
Pt's son or his wife can be reached at [phone] (son) or [phone] (wife) Dawn CourtsOctave Curtis or Dawn Curtis

## 2016-08-07 NOTE — ED Triage Notes (Signed)
Pt from home via EMS with complaints of emesis and diarrhea. This began about 5 days ago. Pt states she has not had emesis today. Pt is still experiencing diarrhea and has abdominal cramps

## 2016-08-07 NOTE — ED Provider Notes (Signed)
[facility] Provider Note   CSN: [id number] Arrival date & time: 08/07/16  2243  By signing my name below, I, Majel Homer, attest that this documentation has been prepared under the direction and in the presence of Deneene Tarver, MD . Electronically Signed: Majel Homer, Scribe. 08/08/2016. 1:29 AM.  History   Chief Complaint Chief Complaint  Patient presents with  . Abdominal Pain  . Emesis  . Diarrhea   The history is provided by the patient. No language interpreter was used.  Diarrhea   This is a new problem. The current episode started more than 2 days ago. There has been no fever. Associated symptoms include abdominal pain. She has tried nothing for the symptoms.   HPI Comments: Dawn Curtis is a 80 y.o. female with PMHx of type II DM, HTN, MI and renal insufficiency, who presents to the Emergency Department for an evaluation of multiple episodes of diarrhea that began 5 days ago and worsened today. Pt reports she had 1 episode of diarrhea 5 days ago with associated abdominal pain that relieved and then returned this evening. She notes she began to experience gas this evening and went to the bathroom but began to "black out" and became dizzy when trying to have a BM. She notes this was the first time she had a BM since 5 days ago. Pt reports she called someone for help who helped her off the commode when she began to have another "unctonrollable" episode of diarrhea. She notes associated pain to the back of her neck that also began this evening. She denies straining when passing her BM and sick contacts. 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   Past Medical History:  Diagnosis Date  . Diabetes mellitus without complication (HCC)   . Hypertension   . MI (myocardial infarction) (HCC)   . Renal insufficiency     Patient Active Problem List   Diagnosis Date Noted  . DIABETES MELLITUS, TYPE II 05/22/2007  . HYPERTENSION 05/22/2007  . DEFORMITY, FOOT NEC,  CONGENITAL 05/22/2007    Past Surgical History:  Procedure Laterality Date  . NEPHRECTOMY Left     OB History    No data available     Home Medications    Prior to Admission medications   Medication Sig Start Date End Date Taking? Authorizing Provider  Ascorbic Acid (VITAMIN C) 1000 MG tablet Take 1,000 mg by mouth daily.    Historical Provider, MD  cyanocobalamin 500 MCG tablet Take 1,000 mcg by mouth daily.    Historical Provider, MD  GARLIC PO Take 3 capsules by mouth daily.    Historical Provider, MD  glipiZIDE (GLUCOTROL XL) 5 MG 24 hr tablet Take 5 mg by mouth daily with breakfast.    Historical Provider, MD  L-Glutamine 500 MG CAPS Take 500 mg by mouth daily.    Historical Provider, MD  lisinopril-hydrochlorothiazide (PRINZIDE,ZESTORETIC) 10-12.5 MG tablet Take 1 tablet by mouth daily. 08/03/16   Linna Hoff, MD    Family History No family history on file.  Social History Social History  Substance Use Topics  . Smoking status: Never Smoker  . Smokeless tobacco: Never Used  . Alcohol use No   Allergies   Amoxicillin and Penicillins  Review of Systems Review of Systems  Gastrointestinal: Positive for abdominal pain and diarrhea.  Musculoskeletal: Positive for neck pain.  Neurological: Positive for dizziness.   Physical Exam Updated Vital Signs BP 119/56 (BP Location: Right Arm)   Pulse  62   Temp 97.5 F (36.4 C) (Oral)   Resp 18   SpO2 100%   Physical Exam  Constitutional: She is oriented to person, place, and time. She appears well-developed and well-nourished. No distress.  HENT:  Head: Normocephalic.  Mouth/Throat: Oropharynx is clear and moist. No oropharyngeal exudate.  Moist mucous membranes   Eyes: Conjunctivae and EOM are normal. Pupils are equal, round, and reactive to light. Right eye exhibits no discharge. Left eye exhibits no discharge. No scleral icterus.  Neck: Normal range of motion. Neck supple. No JVD present. No tracheal deviation  present.  Neck supple  Cardiovascular: Normal rate, regular rhythm, normal heart sounds and intact distal pulses.   No murmur heard. Pulmonary/Chest: Effort normal and breath sounds normal. No stridor. No respiratory distress. She has no wheezes. She has no rales.  Lungs CTA bilaterally.  Abdominal: Soft. Bowel sounds are normal. She exhibits no distension and no mass. There is no tenderness. There is no rebound and no guarding.  Musculoskeletal: Normal range of motion. She exhibits no edema.  Lymphadenopathy:    She has no cervical adenopathy.  Neurological: She is alert and oriented to person, place, and time. She has normal reflexes. She displays normal reflexes. She exhibits normal muscle tone.  Skin: Skin is warm.  Psychiatric: She has a normal mood and affect. Her behavior is normal.  Nursing note and vitals reviewed.  ED Treatments / Results   Vitals:   08/08/16 0400 08/08/16 0430  BP: (!) 122/54 120/62  Pulse: 61 66  Resp: 15 13  Temp:     Results for orders placed or performed during the hospital encounter of 08/07/16  Lipase, blood  Result Value Ref Range   Lipase 40 11 - 51 U/L  Comprehensive metabolic panel  Result Value Ref Range   Sodium 138 135 - 145 mmol/L   Potassium 3.6 3.5 - 5.1 mmol/L   Chloride 107 101 - 111 mmol/L   CO2 21 (L) 22 - 32 mmol/L   Glucose, Bld 148 (H) 65 - 99 mg/dL   BUN 38 (H) 6 - 20 mg/dL   Creatinine, Ser 1.611.66 (H) 0.44 - 1.00 mg/dL   Calcium 9.3 8.9 - 09.610.3 mg/dL   Total Protein 7.4 6.5 - 8.1 g/dL   Albumin 4.0 3.5 - 5.0 g/dL   AST 25 15 - 41 U/L   ALT 18 14 - 54 U/L   Alkaline Phosphatase 92 38 - 126 U/L   Total Bilirubin 0.4 0.3 - 1.2 mg/dL   GFR calc non Af Amer 28 (L) >60 mL/min   GFR calc Af Amer 33 (L) >60 mL/min   Anion gap 10 5 - 15  CBC  Result Value Ref Range   WBC 7.9 4.0 - 10.5 K/uL   RBC 4.00 3.87 - 5.11 MIL/uL   Hemoglobin 10.2 (L) 12.0 - 15.0 g/dL   HCT 04.530.2 (L) 40.936.0 - 81.146.0 %   MCV 75.5 (L) 78.0 - 100.0 fL   MCH  25.5 (L) 26.0 - 34.0 pg   MCHC 33.8 30.0 - 36.0 g/dL   RDW 91.413.8 78.211.5 - 95.615.5 %   Platelets 210 150 - 400 K/uL  Urinalysis, Routine w reflex microscopic  Result Value Ref Range   Color, Urine YELLOW YELLOW   APPearance CLEAR CLEAR   Specific Gravity, Urine 1.013 1.005 - 1.030   pH 5.0 5.0 - 8.0   Glucose, UA NEGATIVE NEGATIVE mg/dL   Hgb urine dipstick NEGATIVE NEGATIVE   Bilirubin  Urine NEGATIVE NEGATIVE   Ketones, ur NEGATIVE NEGATIVE mg/dL   Protein, ur NEGATIVE NEGATIVE mg/dL   Nitrite NEGATIVE NEGATIVE   Leukocytes, UA SMALL (A) NEGATIVE  Urine microscopic-add on  Result Value Ref Range   Squamous Epithelial / LPF 0-5 (A) NONE SEEN   WBC, UA 6-30 0 - 5 WBC/hpf   RBC / HPF NONE SEEN 0 - 5 RBC/hpf   Bacteria, UA RARE (A) NONE SEEN   Dg Abd Acute W/chest  Result Date: 08/08/2016 CLINICAL DATA:  Acute onset of diarrhea and nausea. Mid abdominal pain and weakness. Initial encounter. EXAM: DG ABDOMEN ACUTE W/ 1V CHEST COMPARISON:  CTA of the chest performed 01/19/2016 FINDINGS: The lungs are well-aerated and clear. There is no evidence of focal opacification, pleural effusion or pneumothorax. The cardiomediastinal silhouette is within normal limits. The visualized bowel gas pattern is unremarkable. Scattered stool and air are seen within the colon; there is no evidence of small bowel dilatation to suggest obstruction. No free intra-abdominal air is identified on the provided upright view. No acute osseous abnormalities are seen; the sacroiliac joints are unremarkable in appearance. Mild degenerative change is noted at the lower lumbar spine. Prominent calcifications are seen within the pelvis. IMPRESSION: 1. Unremarkable bowel gas pattern; no free intra-abdominal air seen. Small to moderate amount of stool noted in the colon. 2. No acute cardiopulmonary process seen. Electronically Signed   By: Roanna Raider M.D.   On: 08/08/2016 03:46   Radiology No results found.  Procedures Procedures   DIAGNOSTIC STUDIES:  Oxygen Saturation is 100% on RA, normal by my interpretation.    COORDINATION OF CARE:  1:24 AM Discussed treatment plan with pt at bedside and pt agreed to plan.  Medications Ordered in ED Medications - No data to display   Initial Impression / Assessment and Plan / ED Course  I have reviewed the triage vital signs and the nursing notes.  Pertinent labs & imaging results that were available during my care of the patient were reviewed by me and considered in my medical decision making (see chart for details).  Clinical Course   Medications  ondansetron (ZOFRAN) injection 4 mg (4 mg Intravenous Refused 08/08/16 0319)  sodium chloride 0.9 % bolus 1,000 mL (0 mLs Intravenous Stopped 08/08/16 0450)   PO challenged successfully refused zofran as told nurse she was not nauseated.   I personally performed the services described in this documentation, which was scribed in my presence. The recorded information has been reviewed and is accurate.   Final Clinical Impressions(s) / ED Diagnoses   Final diagnoses:  None  All questions answered to patient's satisfaction. Based on history and exam patient has been appropriately medically screened and emergency conditions excluded. Patient is stable for discharge at this time. Follow up with your PMD for recheck in 2 days and strict return precautions given.    New Prescriptions New Prescriptions   No medications on file     Knoxx Boeding, MD 08/08/16 [phone]

## 2016-08-07 NOTE — ED Notes (Signed)
Bed: [facility]03 Expected date:  Expected time:  Means of arrival:  Comments: EMS-N/V/D 

## 2016-08-08 ENCOUNTER — Emergency Department (HOSPITAL_COMMUNITY): Payer: Medicare PPO

## 2016-08-08 ENCOUNTER — Encounter (HOSPITAL_COMMUNITY): Payer: Self-pay | Admitting: Emergency Medicine

## 2016-08-08 LAB — COMPREHENSIVE METABOLIC PANEL
ALBUMIN: 4 g/dL (ref 3.5–5.0)
ALK PHOS: 92 U/L (ref 38–126)
ALT: 18 U/L (ref 14–54)
AST: 25 U/L (ref 15–41)
Anion gap: 10 (ref 5–15)
BILIRUBIN TOTAL: 0.4 mg/dL (ref 0.3–1.2)
BUN: 38 mg/dL — AB (ref 6–20)
CALCIUM: 9.3 mg/dL (ref 8.9–10.3)
CO2: 21 mmol/L — ABNORMAL LOW (ref 22–32)
CREATININE: 1.66 mg/dL — AB (ref 0.44–1.00)
Chloride: 107 mmol/L (ref 101–111)
GFR calc Af Amer: 33 mL/min — ABNORMAL LOW (ref >60)
GFR, EST NON AFRICAN AMERICAN: 28 mL/min — AB (ref >60)
GLUCOSE: 148 mg/dL — AB (ref 65–99)
Potassium: 3.6 mmol/L (ref 3.5–5.1)
Sodium: 138 mmol/L (ref 135–145)
TOTAL PROTEIN: 7.4 g/dL (ref 6.5–8.1)

## 2016-08-08 LAB — URINALYSIS, ROUTINE W REFLEX MICROSCOPIC
BILIRUBIN URINE: NEGATIVE
Glucose, UA: NEGATIVE mg/dL
Hgb urine dipstick: NEGATIVE
KETONES UR: NEGATIVE mg/dL
NITRITE: NEGATIVE
PH: 5 (ref 5.0–8.0)
PROTEIN: NEGATIVE mg/dL
Specific Gravity, Urine: 1.013 (ref 1.005–1.030)

## 2016-08-08 LAB — LIPASE, BLOOD: Lipase: 40 U/L (ref 11–51)

## 2016-08-08 LAB — URINE MICROSCOPIC-ADD ON: RBC / HPF: NONE SEEN RBC/hpf (ref 0–5)

## 2016-08-08 MED ORDER — ONDANSETRON 8 MG PO TBDP
ORAL_TABLET | ORAL | 0 refills | Status: AC
Start: 2016-08-08 — End: ?

## 2016-08-08 MED ORDER — SODIUM CHLORIDE 0.9 % IV BOLUS (SEPSIS)
1000.0000 mL | Freq: Once | INTRAVENOUS | Status: AC
  Administered 2016-08-08: 1000 mL via INTRAVENOUS

## 2016-08-08 MED ORDER — ONDANSETRON HCL 4 MG/2ML IJ SOLN
4.0000 mg | Freq: Once | INTRAMUSCULAR | Status: DC
  Filled 2016-08-08: qty 2

## 2016-08-08 NOTE — ED Notes (Signed)
Pt ambulatory and independent when walked to lobby.

## 2016-08-08 NOTE — ED Notes (Signed)
This RN attempted to help pt provide a urine sample. Pt was unable to at this time. Pt states that she will be refusing a catheter if we ask to use one, but pt states she will try in a little while to use the bathroom again

## 2016-08-08 NOTE — ED Notes (Signed)
Pt requested to wait until her ride got here at 0630 to be walked out to the lobby.  Spoke with charge nurse and as long as we have rooms available pt will stay until then.  Pt verbalized understanding of discharge instructions.

## 2016-09-27 ENCOUNTER — Encounter (HOSPITAL_COMMUNITY): Payer: Self-pay | Admitting: *Deleted

## 2016-09-27 ENCOUNTER — Emergency Department (HOSPITAL_COMMUNITY): Payer: Medicare PPO

## 2016-09-27 ENCOUNTER — Inpatient Hospital Stay (HOSPITAL_COMMUNITY)
Admission: EM | Admit: 2016-09-27 | Discharge: 2016-09-30 | DRG: 281 | Disposition: A | Discharge status: Home / Self Care | Payer: Medicare PPO | Attending: Internal Medicine | Admitting: Internal Medicine

## 2016-09-27 DIAGNOSIS — Z8249 Family history of ischemic heart disease and other diseases of the circulatory system: Secondary | ICD-10-CM | POA: Diagnosis not present

## 2016-09-27 DIAGNOSIS — N183 Chronic kidney disease, stage 3 (moderate): Secondary | ICD-10-CM | POA: Diagnosis present

## 2016-09-27 DIAGNOSIS — R011 Cardiac murmur, unspecified: Secondary | ICD-10-CM | POA: Diagnosis present

## 2016-09-27 DIAGNOSIS — D509 Iron deficiency anemia, unspecified: Secondary | ICD-10-CM | POA: Diagnosis present

## 2016-09-27 DIAGNOSIS — I2510 Atherosclerotic heart disease of native coronary artery without angina pectoris: Secondary | ICD-10-CM

## 2016-09-27 DIAGNOSIS — I309 Acute pericarditis, unspecified: Secondary | ICD-10-CM

## 2016-09-27 DIAGNOSIS — E1122 Type 2 diabetes mellitus with diabetic chronic kidney disease: Secondary | ICD-10-CM | POA: Diagnosis present

## 2016-09-27 DIAGNOSIS — I252 Old myocardial infarction: Secondary | ICD-10-CM | POA: Diagnosis not present

## 2016-09-27 DIAGNOSIS — R748 Abnormal levels of other serum enzymes: Secondary | ICD-10-CM | POA: Diagnosis not present

## 2016-09-27 DIAGNOSIS — E785 Hyperlipidemia, unspecified: Secondary | ICD-10-CM | POA: Diagnosis present

## 2016-09-27 DIAGNOSIS — I272 Pulmonary hypertension, unspecified: Secondary | ICD-10-CM | POA: Diagnosis present

## 2016-09-27 DIAGNOSIS — E119 Type 2 diabetes mellitus without complications: Secondary | ICD-10-CM

## 2016-09-27 DIAGNOSIS — M1990 Unspecified osteoarthritis, unspecified site: Secondary | ICD-10-CM | POA: Diagnosis present

## 2016-09-27 DIAGNOSIS — Z79899 Other long term (current) drug therapy: Secondary | ICD-10-CM

## 2016-09-27 DIAGNOSIS — R079 Chest pain, unspecified: Secondary | ICD-10-CM | POA: Diagnosis present

## 2016-09-27 DIAGNOSIS — I319 Disease of pericardium, unspecified: Secondary | ICD-10-CM | POA: Diagnosis present

## 2016-09-27 DIAGNOSIS — Z880 Allergy status to penicillin: Secondary | ICD-10-CM | POA: Diagnosis not present

## 2016-09-27 DIAGNOSIS — Z955 Presence of coronary angioplasty implant and graft: Secondary | ICD-10-CM | POA: Diagnosis not present

## 2016-09-27 DIAGNOSIS — I10 Essential (primary) hypertension: Secondary | ICD-10-CM

## 2016-09-27 DIAGNOSIS — I129 Hypertensive chronic kidney disease with stage 1 through stage 4 chronic kidney disease, or unspecified chronic kidney disease: Secondary | ICD-10-CM | POA: Diagnosis present

## 2016-09-27 DIAGNOSIS — E1101 Type 2 diabetes mellitus with hyperosmolarity with coma: Secondary | ICD-10-CM

## 2016-09-27 DIAGNOSIS — E876 Hypokalemia: Secondary | ICD-10-CM | POA: Diagnosis present

## 2016-09-27 DIAGNOSIS — I514 Myocarditis, unspecified: Secondary | ICD-10-CM | POA: Diagnosis present

## 2016-09-27 DIAGNOSIS — I214 Non-ST elevation (NSTEMI) myocardial infarction: Principal | ICD-10-CM

## 2016-09-27 DIAGNOSIS — Z905 Acquired absence of kidney: Secondary | ICD-10-CM | POA: Diagnosis not present

## 2016-09-27 LAB — BASIC METABOLIC PANEL
Anion gap: 9 (ref 5–15)
BUN: 35 mg/dL — AB (ref 6–20)
CHLORIDE: 103 mmol/L (ref 101–111)
CO2: 24 mmol/L (ref 22–32)
Calcium: 8.6 mg/dL — ABNORMAL LOW (ref 8.9–10.3)
Creatinine, Ser: 1.58 mg/dL — ABNORMAL HIGH (ref 0.44–1.00)
GFR calc Af Amer: 35 mL/min — ABNORMAL LOW (ref >60)
GFR calc non Af Amer: 30 mL/min — ABNORMAL LOW (ref >60)
Glucose, Bld: 151 mg/dL — ABNORMAL HIGH (ref 65–99)
POTASSIUM: 3.2 mmol/L — AB (ref 3.5–5.1)
SODIUM: 136 mmol/L (ref 135–145)

## 2016-09-27 LAB — I-STAT TROPONIN, ED: Troponin i, poc: 1.13 ng/mL (ref 0.00–0.08)

## 2016-09-27 LAB — CBC
HEMATOCRIT: 27.9 % — AB (ref 36.0–46.0)
Hemoglobin: 9.3 g/dL — ABNORMAL LOW (ref 12.0–15.0)
MCH: 25.3 pg — ABNORMAL LOW (ref 26.0–34.0)
MCHC: 33.3 g/dL (ref 30.0–36.0)
MCV: 76 fL — AB (ref 78.0–100.0)
Platelets: 268 10*3/uL (ref 150–400)
RBC: 3.67 MIL/uL — AB (ref 3.87–5.11)
RDW: 14.1 % (ref 11.5–15.5)
WBC: 8.7 10*3/uL (ref 4.0–10.5)

## 2016-09-27 LAB — HEPARIN LEVEL (UNFRACTIONATED): HEPARIN UNFRACTIONATED: 0.18 [IU]/mL — AB (ref 0.30–0.70)

## 2016-09-27 LAB — MRSA PCR SCREENING: MRSA by PCR: NEGATIVE

## 2016-09-27 LAB — PROTIME-INR
INR: 1.06
PROTHROMBIN TIME: 13.8 s (ref 11.4–15.2)

## 2016-09-27 LAB — TROPONIN I
TROPONIN I: 0.91 ng/mL — AB (ref <0.03)
Troponin I: 0.76 ng/mL (ref <0.03)

## 2016-09-27 LAB — GLUCOSE, CAPILLARY: Glucose-Capillary: 266 mg/dL — ABNORMAL HIGH (ref 65–99)

## 2016-09-27 MED ORDER — POTASSIUM CHLORIDE CRYS ER 20 MEQ PO TBCR
40.0000 meq | EXTENDED_RELEASE_TABLET | Freq: Two times a day (BID) | ORAL | Status: AC
  Administered 2016-09-27 – 2016-09-28 (×2): 40 meq via ORAL
  Filled 2016-09-27 (×2): qty 2

## 2016-09-27 MED ORDER — HEPARIN BOLUS VIA INFUSION
3500.0000 [IU] | Freq: Once | INTRAVENOUS | Status: AC
  Administered 2016-09-27: 3500 [IU] via INTRAVENOUS
  Filled 2016-09-27: qty 3500

## 2016-09-27 MED ORDER — HEPARIN (PORCINE) IN NACL 100-0.45 UNIT/ML-% IJ SOLN
850.0000 [IU]/h | INTRAMUSCULAR | Status: DC
  Administered 2016-09-27: 700 [IU]/h via INTRAVENOUS
  Filled 2016-09-27: qty 250

## 2016-09-27 MED ORDER — NITROGLYCERIN IN D5W 200-5 MCG/ML-% IV SOLN
0.0000 ug/min | Freq: Once | INTRAVENOUS | Status: AC
  Administered 2016-09-27: 5 ug/min via INTRAVENOUS
  Filled 2016-09-27: qty 250

## 2016-09-27 MED ORDER — ASPIRIN 81 MG PO CHEW
324.0000 mg | CHEWABLE_TABLET | Freq: Once | ORAL | Status: AC
  Administered 2016-09-27: 324 mg via ORAL
  Filled 2016-09-27: qty 4

## 2016-09-27 MED ORDER — INSULIN ASPART 100 UNIT/ML ~~LOC~~ SOLN
0.0000 [IU] | Freq: Three times a day (TID) | SUBCUTANEOUS | Status: DC
  Administered 2016-09-28 – 2016-09-30 (×3): 1 [IU] via SUBCUTANEOUS

## 2016-09-27 MED ORDER — IBUPROFEN 200 MG PO TABS
400.0000 mg | ORAL_TABLET | Freq: Three times a day (TID) | ORAL | Status: DC
  Administered 2016-09-27 – 2016-09-30 (×8): 400 mg via ORAL
  Filled 2016-09-27 (×8): qty 2

## 2016-09-27 MED ORDER — INSULIN ASPART 100 UNIT/ML ~~LOC~~ SOLN
0.0000 [IU] | Freq: Every day | SUBCUTANEOUS | Status: DC
  Administered 2016-09-27: 3 [IU] via SUBCUTANEOUS

## 2016-09-27 MED ORDER — NITROGLYCERIN 0.4 MG SL SUBL
0.4000 mg | SUBLINGUAL_TABLET | Freq: Once | SUBLINGUAL | Status: AC
  Administered 2016-09-27: 0.4 mg via SUBLINGUAL
  Filled 2016-09-27: qty 1

## 2016-09-27 MED ORDER — NITROGLYCERIN IN D5W 200-5 MCG/ML-% IV SOLN
0.0000 ug/min | Freq: Once | INTRAVENOUS | Status: AC
  Administered 2016-09-27: 10 ug/min via INTRAVENOUS

## 2016-09-27 MED ORDER — COLCHICINE 0.6 MG PO TABS
0.6000 mg | ORAL_TABLET | Freq: Two times a day (BID) | ORAL | Status: DC
  Administered 2016-09-27 – 2016-09-30 (×6): 0.6 mg via ORAL
  Filled 2016-09-27 (×7): qty 1

## 2016-09-27 NOTE — Progress Notes (Signed)
ANTICOAGULATION CONSULT NOTE - Initial Consult  Pharmacy Consult for Heparin Indication: chest pain/ACS  Allergies  Allergen Reactions  . Amoxicillin     REACTION: unspecified  . Penicillins     Has patient had a PCN reaction causing immediate rash, facial/tongue/throat swelling, SOB or lightheadedness with hypotension: yes  Has patient had a PCN reaction causing severe rash involving mucus membranes or skin necrosis: NO Has patient had a PCN reaction that required hospitalization  Has patient had a PCN reaction occurring within the last 10 years: NO If all of the above answers are "NO", then may proceed with Cephalosporin use.   Patient Measurements: Height: 5' 5.5" (166.4 cm) Weight: 129 lb (58.5 kg) IBW/kg (Calculated) : 58.15 Heparin Dosing Weight: 58.5kg  Vital Signs: Temp: 97.5 F (36.4 C) (10/27 1335) Temp Source: Oral (10/27 1335) BP: 140/57 (10/27 1335) Pulse Rate: 72 (10/27 1335)  Labs:  Recent Labs  09/27/16 1408  HGB 9.3*  HCT 27.9*  PLT 268   CrCl cannot be calculated (Patient's most recent lab result is older than the maximum 21 days allowed.).  Medical History: Past Medical History:  Diagnosis Date  . Diabetes mellitus without complication (HCC)   . Hypertension   . MI (myocardial infarction)   . Renal insufficiency    Medications:  Scheduled:   Infusions:  . heparin 700 Units/hr (09/27/16 1514)   Assessment: 80 yoF to ED with chest pain. Hx of probable URI one week ago, then 2 days ago developed reflux/chest pain at night, did not feel like MI.  Troponin initially 1.13, hx of MI and 3 stents.  No anti-coagulation PTA, baseline H/H low, Plt wnl  SCr elevated, 1.58 Cl ~ 33 normalized  Goal of Therapy:  Heparin level 0.3-0.7 units/ml Monitor platelets by anticoagulation protocol: Yes   Plan:   Heparin bolus 3500 units (charted 15:15), infusion at 700 units/hr  First Heparin level in 8 hr (23:30)  Daily Heparin level when at steady  state, daily CBC while on Heparin  Otho BellowsGreen, Carlton Buskey L PharmD Pager [phone] 09/27/2016, 3:54 PM

## 2016-09-27 NOTE — ED Triage Notes (Signed)
Pt reports left side chest and back pain, as well as cough.

## 2016-09-27 NOTE — H&P (Signed)
Patient ID: Cloyd StagersBarbara E Biasi MRN: [id number], DOB/AGE: 78/06/1936   Admit date: 09/27/2016   Primary Physician: No PCP Per Patient Primary Cardiologist: New  Pt. Profile:  80 y/o AA female with h/o CAD, HTN, and DM, presenting to the ED with complaint of chest pain who has ruled in for NSTEMI with initial POC troponin of 1.13.    Problem List  Past Medical History:  Diagnosis Date  . Diabetes mellitus without complication (HCC)   . Hypertension   . MI (myocardial infarction)   . Renal insufficiency     Past Surgical History:  Procedure Laterality Date  . NEPHRECTOMY Left      Allergies  Allergies  Allergen Reactions  . Amoxicillin     REACTION: unspecified  . Penicillins     Has patient had a PCN reaction causing immediate rash, facial/tongue/throat swelling, SOB or lightheadedness with hypotension: yes  Has patient had a PCN reaction causing severe rash involving mucus membranes or skin necrosis: NO Has patient had a PCN reaction that required hospitalization  Has patient had a PCN reaction occurring within the last 10 years: NO If all of the above answers are "NO", then may proceed with Cephalosporin use.    HPI  80 y/o AA female with h/o CAD, HTN, and DM, presenting to the ED with complaint of chest pain who has ruled in for NSTEMI with initial POC troponin of 1.13.    She reports h/o coronary stenting in [place] 3 1/2 years ago. She cannot recall the name of the hospital and unsure the exact vessel(s) involved. She has DM but not on insulin. She is on medication for BP. Not sure if she has HLD. She denies tobacco and ETOH use. 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   She reports that she was in her usual state of health until 2 nights ago when she developed SSSP described as tightness. She had more of indigestion like discomfort when she got her stents several years ago. Not so much now. Over the last day or so, she has had  recurrent intermittent pain, substernal and left sided pain. No significant dyspnea. She has felt nauseated. She did have relief in ED with SL NTG. VSS. EKG shows NSR w/o ischemia.   Home Medications  Prior to Admission medications   Medication Sig Start Date End Date Taking? Authorizing Provider  Ascorbic Acid (VITAMIN C) 1000 MG tablet Take 1,000 mg by mouth daily.    Historical Provider, MD  cyanocobalamin 500 MCG tablet Take 1,000 mcg by mouth daily.    Historical Provider, MD  GARLIC PO Take 3 capsules by mouth daily.    Historical Provider, MD  L-Glutamine 500 MG CAPS Take 500 mg by mouth daily.    Historical Provider, MD  lisinopril-hydrochlorothiazide (PRINZIDE,ZESTORETIC) 10-12.5 MG tablet Take 1 tablet by mouth daily. 08/03/16   Linna HoffJames D Kindl, MD  ondansetron (ZOFRAN ODT) 8 MG disintegrating tablet 8mg  ODT q8 hours prn nausea 08/08/16   April Palumbo, MD    Family History  Family History  Problem Relation Age of Onset  . Hypertension Mother     Social History  Social History   Social History  . Marital status: Divorced    Spouse name: N/A  . Number of children: N/A  . Years of education: N/A   Occupational History  . Not on file.   Social History Main Topics  . Smoking status: Never Smoker  .  Smokeless tobacco: Never Used  . Alcohol use No  . Drug use: No  . Sexual activity: Not on file   Other Topics Concern  . Not on file   Social History Narrative  . No narrative on file     Review of Systems General:  No chills, fever, night sweats or weight changes.  Cardiovascular:  + chest pain, dyspnea on exertion, edema, orthopnea, palpitations, paroxysmal nocturnal dyspnea. Dermatological: No rash, lesions/masses Respiratory: No cough, dyspnea Urologic: No hematuria, dysuria Abdominal:   No nausea, vomiting, diarrhea, bright red blood per rectum, melena, or hematemesis Neurologic:  No visual changes, wkns, changes in mental status. All other systems reviewed and  are otherwise negative except as noted above.  Physical Exam  Blood pressure 142/61, pulse 62, temperature 97.5 F (36.4 C), temperature source Oral, resp. rate 19, height 5' 5.5" (1.664 m), weight 129 lb (58.5 kg), SpO2 100 %.  General: Pleasant, NAD Psych: Normal affect. Neuro: Alert and oriented X 3. Moves all extremities spontaneously. HEENT: Normal  Neck: Supple without bruits or JVD. Lungs:  Resp regular and unlabored, CTA. Heart: RRR no s3, s4, or murmurs. Abdomen: Soft, non-tender, non-distended, BS + x 4.  Extremities: No clubbing, cyanosis or edema. DP/PT/Radials 2+ and equal bilaterally.  Labs  Troponin [facility] Test)  Recent Labs  09/27/16 1423  TROPIPOC 1.13*   No results for input(s): CKTOTAL, CKMB, TROPONINI in the last 72 hours. Lab Results  Component Value Date   WBC 8.7 09/27/2016   HGB 9.3 (L) 09/27/2016   HCT 27.9 (L) 09/27/2016   MCV 76.0 (L) 09/27/2016   PLT 268 09/27/2016     Recent Labs Lab 09/27/16 1408  NA 136  K 3.2*  CL 103  CO2 24  BUN 35*  CREATININE 1.58*  CALCIUM 8.6*  GLUCOSE 151*   No results found for: CHOL, HDL, LDLCALC, TRIG Lab Results  Component Value Date   DDIMER 3.29 (H) 01/19/2016     Radiology/Studies  Dg Chest 2 View  Result Date: 09/27/2016 CLINICAL DATA:  Left-sided chest pain for 2 days. EXAM: CHEST  2 VIEW COMPARISON:  08/08/2016 FINDINGS: The cardiac silhouette, mediastinal and hilar contours are within normal limits and stable. Mild tortuosity of the thoracic aorta mainly due to thoracic scoliosis. Streak E bibasilar subsegmental atelectasis and slightly low lung volumes but no infiltrates, edema or effusions. No worrisome pulmonary lesions. IMPRESSION: Streaky bibasilar atelectasis but no infiltrates or effusions. Electronically Signed   By: Rudie Meyer M.D.   On: 09/27/2016 14:23    ECG  NSR. No ischemia.   ASSESSMENT AND PLAN  Principal Problem:   NSTEMI (non-ST elevated myocardial  infarction) (HCC) Active Problems:   DM type 2 (diabetes mellitus, type 2) (HCC)   CAD (coronary artery disease)  1. NSTEMI/CAD: patient with h/o coronary stenting in [place] 3 1/2 years ago. She cannot recall the name of the hospital and unsure the exact vessel(s) involved. She has other risk factors including DM and HTN. She has had symptoms of unstable angina and initial troponin level of 1.13. Scr is 1.5. She is CP free on IV heparin. She will need to be admitted. Continue IV heparin and nitro. Continue to cycle cardiac enzymes. Recommend LHC on Monday. MD to follow with further recommendations.     Signed, Robbie Lis, PA-C 09/27/2016, 4:13 PM  The patient was seen, examined and discussed with Robbie Lis, PA-C.   This is a very pleasant 80 year old female with prior  medical history of coronary artery disease, status post stenting of an unknown artery 3 years ago in [place], [place]. She presented with 3 days of  upper respiratory infection, cough, chest heaviness and sharp chest pain when she lays in bed and takes deep breath. The pain is sharp, worse on inspiration, with no radiation.  She feels it mostly at night. This has been progressively worsening over last 3 days to the point that she came to the ER. 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 Her first troponin is elevated at 1.1 and she was started on nitroglycerin and heparin drip. Her EKG shows normal sinus rhythm and early repolarization. She states she is taking ibuprofen 800 mg a day for arthritis.  On her physical exam she has 2/6 systolic murmur and loud  systolic and diastolic rub. Her clinical presentation and physical exam highly suspicious for acute myopericarditis. I would continue to cycle troponins and unless significant elevation, I would hold off on cath as this presentation seems to be sec to myopericarditis. Continue heparin until  Troponin is down  trending. She refused to be transferred to [facility] and would like to stay here at [facility]. In that case she will need to be admitted to a hospitalist service and we will see her as a consult. We will order an echocardiogram to evaluate for wall motion abnormalities and pericardial effusion.  Tobias Alexander 09/27/2016

## 2016-09-27 NOTE — ED Notes (Signed)
EDP made aware of patient troponin result. 

## 2016-09-27 NOTE — ED Provider Notes (Addendum)
[facility]-EMERGENCY DEPT Provider Note   CSN: [id number] Arrival date & time: 09/27/16  1306     History   Chief Complaint Chief Complaint  Patient presents with  . Chest Pain    HPI Dawn Curtis is a 80 y.o. female.  HPI Patient presents with chest pain. States that around a week ago she had a cough with some mild sputum production. States it felt a little tight in the chest of the time. Around 2 days ago she began to have pain at night. States it is in the middle of her chest and felt a little like reflux. States that she prayed on it with her pastor. She did fine during the day and was able to work at her usual job. States she was not in a great mood but otherwise did fine. No more pain until last night. States that last night she had the same pain in the middle of her chest although it also added onto her left chest. States it was not really a pain. States that she took 800 mg Motrin and the center part of it went away but there still is the area on the left side of the chest. No shortness of breath. No swelling or legs. States she previously had a heart attack and has 3 stents. States this did not feel like that. States she just felt best weak at that time. No blood in the stool. No nausea or vomiting.   Past Medical History:  Diagnosis Date  . Diabetes mellitus without complication (HCC)   . Hypertension   . MI (myocardial infarction)   . Renal insufficiency     Patient Active Problem List   Diagnosis Date Noted  . CAD (coronary artery disease) 09/27/2016  . NSTEMI (non-ST elevated myocardial infarction) (HCC) 09/27/2016  . DM type 2 (diabetes mellitus, type 2) (HCC) 05/22/2007  . Benign essential HTN 05/22/2007  . DEFORMITY, FOOT NEC, CONGENITAL 05/22/2007    Past Surgical History:  Procedure Laterality Date  . NEPHRECTOMY Left     OB History    No data available       Home Medications    Prior to Admission medications   Medication Sig Start Date End Date  Taking? Authorizing Provider  Ascorbic Acid (VITAMIN C) 1000 MG tablet Take 1,000 mg by mouth daily.   Yes Historical Provider, MD  cyanocobalamin 500 MCG tablet Take 1,000 mcg by mouth daily.   Yes Historical Provider, MD  GARLIC PO Take 3 capsules by mouth daily.   Yes Historical Provider, MD  L-Glutamine 500 MG CAPS Take 500 mg by mouth daily.   Yes Historical Provider, MD  lisinopril-hydrochlorothiazide (PRINZIDE,ZESTORETIC) 10-12.5 MG tablet Take 1 tablet by mouth daily. 08/03/16  Yes Linna HoffJames D Kindl, MD  Probiotic Product (PROBIOTIC PO) Take 1 capsule by mouth daily.   Yes Historical Provider, MD  ondansetron (ZOFRAN ODT) 8 MG disintegrating tablet 8mg  ODT q8 hours prn nausea 08/08/16   April Palumbo, MD    Family History Family History  Problem Relation Age of Onset  . Hypertension Mother     Social History Social History  Substance Use Topics  . Smoking status: Never Smoker  . Smokeless tobacco: Never Used  . Alcohol use No     Allergies   Amoxicillin and Penicillins   Review of Systems Review of Systems  Constitutional: Negative for appetite change.  HENT: Negative for congestion.   Eyes: Negative for redness.  Respiratory: Positive for chest tightness. Negative for  shortness of breath.   Cardiovascular: Positive for chest pain. Negative for palpitations and leg swelling.  Gastrointestinal: Negative for abdominal pain.  Genitourinary: Negative for dyspareunia.  Musculoskeletal: Negative for back pain.  Neurological: Negative for facial asymmetry and light-headedness.  Hematological: Negative for adenopathy.  Psychiatric/Behavioral: Negative for behavioral problems.     Physical Exam Updated Vital Signs BP 117/71   Pulse 77   Temp 97.5 F (36.4 C) (Oral)   Resp 17   Ht 5' 5.5" (1.664 m)   Wt 129 lb (58.5 kg)   SpO2 99%   BMI 21.14 kg/m   Physical Exam  Constitutional: She appears well-developed.  HENT:  Head: Normocephalic.  Eyes: Pupils are equal,  round, and reactive to light.  Neck: Neck supple.  Cardiovascular: Normal rate.   Pulmonary/Chest: Effort normal. She exhibits no tenderness.  Abdominal: Soft. There is no tenderness.  Neurological: She is alert.  Skin: Skin is warm. Capillary refill takes less than 2 seconds.     ED Treatments / Results  Labs (all labs ordered are listed, but only abnormal results are displayed) Labs Reviewed  BASIC METABOLIC PANEL - Abnormal; Notable for the following:       Result Value   Potassium 3.2 (*)    Glucose, Bld 151 (*)    BUN 35 (*)    Creatinine, Ser 1.58 (*)    Calcium 8.6 (*)    GFR calc non Af Amer 30 (*)    GFR calc Af Amer 35 (*)    All other components within normal limits  CBC - Abnormal; Notable for the following:    RBC 3.67 (*)    Hemoglobin 9.3 (*)    HCT 27.9 (*)    MCV 76.0 (*)    MCH 25.3 (*)    All other components within normal limits  I-STAT TROPOININ, ED - Abnormal; Notable for the following:    Troponin i, poc 1.13 (*)    All other components within normal limits  PROTIME-INR  HEPARIN LEVEL (UNFRACTIONATED)    EKG  EKG Interpretation  Date/Time:  Friday September 27 2016 13:51:32 EDT Ventricular Rate:  68 PR Interval:    QRS Duration: 94 QT Interval:  388 QTC Calculation: 413 R Axis:   78 Text Interpretation:  Sinus rhythm Confirmed by Rubin Payor  MD, Diavian Furgason [phone]) on 09/27/2016 2:42:14 PM       EKG Interpretation  Date/Time:  Friday September 27 2016 14:53:13 EDT Ventricular Rate:  74 PR Interval:    QRS Duration: 95 QT Interval:  397 QTC Calculation: 441 R Axis:   89 Text Interpretation:  Sinus rhythm Borderline right axis deviation Baseline wander in lead(s) V6 Confirmed by Rubin Payor  MD, Harrold Donath [phone]) on 09/27/2016 3:04:17 PM Also confirmed by Rubin Payor  MD, Avyay Coger [phone]), editor [place], Cala Bradford [phone])  on 09/27/2016 3:33:54 PM         Radiology Dg Chest 2 View  Result Date: 09/27/2016 CLINICAL DATA:  Left-sided chest pain  for 2 days. EXAM: CHEST  2 VIEW COMPARISON:  08/08/2016 FINDINGS: The cardiac silhouette, mediastinal and hilar contours are within normal limits and stable. Mild tortuosity of the thoracic aorta mainly due to thoracic scoliosis. Streak E bibasilar subsegmental atelectasis and slightly low lung volumes but no infiltrates, edema or effusions. No worrisome pulmonary lesions. IMPRESSION: Streaky bibasilar atelectasis but no infiltrates or effusions. Electronically Signed   By: Rudie Meyer M.D.   On: 09/27/2016 14:23    Procedures Procedures (including critical care time)  Medications Ordered in ED Medications  heparin ADULT infusion 100 units/mL (25000 units/264mL sodium chloride 0.45%) (700 Units/hr Intravenous New Bag/Given 09/27/16 1514)  nitroGLYCERIN (NITROSTAT) SL tablet 0.4 mg (0.4 mg Sublingual Given 09/27/16 1514)  aspirin chewable tablet 324 mg (324 mg Oral Given 09/27/16 1515)  heparin bolus via infusion 3,500 Units (3,500 Units Intravenous Bolus from Bag 09/27/16 1513)  nitroGLYCERIN 50 mg in dextrose 5 % 250 mL (0.2 mg/mL) infusion (5 mcg/min Intravenous New Bag/Given 09/27/16 1618)     Initial Impression / Assessment and Plan / ED Course  I have reviewed the triage vital signs and the nursing notes.  Pertinent labs & imaging results that were available during my care of the patient were reviewed by me and considered in my medical decision making (see chart for details).  Clinical Course    Patient with 2 days of chest pain at night. EKG reassuring however troponin is elevated at greater than 1. Still some pain. Will give nitroglycerin and heparin. Will give aspirin. Anemia that hemoglobin is close to her baseline. Denies black stools or bleeding. Will admit to cardiology.  Discussed with Dr. Delton See from cardiology. She thinks this is likely a pericarditis or myopericarditis. Patient does not want to go to [place]. Will admit here to medicine under cardiology request.  Recommends colchicine and ibuprofen. Will trend troponins and if stays stable goes down likely just echo. If goes up may need heart cath.  CRITICAL CARE Performed by: Billee Cashing Total critical care time: 30 minutes Critical care time was exclusive of separately billable procedures and treating other patients. Critical care was necessary to treat or prevent imminent or life-threatening deterioration. Critical care was time spent personally by me on the following activities: development of treatment plan with patient and/or surrogate as well as nursing, discussions with consultants, evaluation of patient's response to treatment, examination of patient, obtaining history from patient or surrogate, ordering and performing treatments and interventions, ordering and review of laboratory studies, ordering and review of radiographic studies, pulse oximetry and re-evaluation of patient's condition.  Final Clinical Impressions(s) / ED Diagnoses   Final diagnoses:  Non-STEMI (non-ST elevated myocardial infarction) [facility])    New Prescriptions New Prescriptions   No medications on file     Benjiman Core, MD 09/27/16 1506    Benjiman Core, MD 09/27/16 1736

## 2016-09-27 NOTE — H&P (Addendum)
History and Physical    Dawn Curtis ZOX:[id number] DOB: Apr 11, 1936 DOA: 09/27/2016  PCP: No PCP Per Patient \ Patient coming from: [facility].   Chief Complaint: soreness in the chest since 2 to 3 days.   HPI: Dawn Curtis is a 80 y.o. female with medical history significant of CAD, s/p PCI, hypertension, diabetes Mellitus, came in for soreness in the  Central part of the chest , radiating to the left side and to the back. She reports some chest tightness, on deep breathing. No fever or chills, slight nausea, denies any abdominal pain, no vomiting. EKG does not show any ischemic changes. Labs revealed elevated troponin, low potassium, creatinine of 1.58, hemoglobin of 9.3. She denies any dysuria.   Review of Systems: As per HPI otherwise 10 point review of systems negative.   Past Medical History:  Diagnosis Date  . Diabetes mellitus without complication (HCC)   . Hypertension   . MI (myocardial infarction)   . Renal insufficiency     Past Surgical History:  Procedure Laterality Date  . NEPHRECTOMY Left      reports that she has never smoked. She has never used smokeless tobacco. She reports that she does not drink alcohol or use drugs.  Allergies  Allergen Reactions  . Amoxicillin     REACTION: unspecified  . Penicillins     Has patient had a PCN reaction causing immediate rash, facial/tongue/throat swelling, SOB or lightheadedness with hypotension: yes  Has patient had a PCN reaction causing severe rash involving mucus membranes or skin necrosis: NO Has patient had a PCN reaction that required hospitalization  Has patient had a PCN reaction occurring within the last 10 years: NO If all of the above answers are "NO", then may proceed with Cephalosporin use.    Family History  Problem Relation Age of Onset  . Hypertension Mother     Family history reviewed and not pertinent   Prior to Admission medications   Medication Sig Start Date End Date Taking? Authorizing  Provider  Ascorbic Acid (VITAMIN C) 1000 MG tablet Take 1,000 mg by mouth daily.   Yes Historical Provider, MD  cyanocobalamin 500 MCG tablet Take 1,000 mcg by mouth daily.   Yes Historical Provider, MD  GARLIC PO Take 3 capsules by mouth daily.   Yes Historical Provider, MD  L-Glutamine 500 MG CAPS Take 500 mg by mouth daily.   Yes Historical Provider, MD  lisinopril-hydrochlorothiazide (PRINZIDE,ZESTORETIC) 10-12.5 MG tablet Take 1 tablet by mouth daily. 08/03/16  Yes Linna Hoff, MD  Probiotic Product (PROBIOTIC PO) Take 1 capsule by mouth daily.   Yes Historical Provider, MD  ondansetron (ZOFRAN ODT) 8 MG disintegrating tablet 8mg  ODT q8 hours prn nausea 08/08/16   April Palumbo, MD    Physical Exam: Vitals:   09/27/16 1650 09/27/16 1655 09/27/16 1700 09/27/16 1759  BP: 148/62 117/71  162/64  Pulse: 64 64 77 67  Resp: 21 18 17 16   Temp:      TempSrc:      SpO2: 97% 97% 99% 98%  Weight:      Height:          Constitutional: NAD, calm, comfortable Vitals:   09/27/16 1650 09/27/16 1655 09/27/16 1700 09/27/16 1759  BP: 148/62 117/71  162/64  Pulse: 64 64 77 67  Resp: 21 18 17 16   Temp:      TempSrc:      SpO2: 97% 97% 99% 98%  Weight:  Height:       Eyes: PERRL, lids and conjunctivae normal ENMT: Mucous membranes are moist. Posterior pharynx clear of any exudate or lesions.Normal dentition.  Neck: normal, supple, no masses, no thyromegaly Respiratory: clear to auscultation bilaterally, no wheezing, no crackles. Normal respiratory effort. No accessory muscle use.  Cardiovascular: Regular rate and rhythm, systolic murmer present. No extremity edema. 2+ pedal pulses. No carotid bruits.  Abdomen: no tenderness, no masses palpated. No hepatosplenomegaly. Bowel sounds positive.  Musculoskeletal: no clubbing / cyanosis. No joint deformity upper and lower extremities. Good ROM, no contractures. Normal muscle tone.  Skin: no rashes, lesions, ulcers. No induration Neurologic: CN  2-12 grossly intact. Sensation intact, DTR normal. Strength 5/5 in all 4.  Psychiatric: Normal judgment and insight. Alert and oriented x 3. Normal mood.     Labs on Admission: I have personally reviewed following labs and imaging studies  CBC:  Recent Labs Lab 09/27/16 1408  WBC 8.7  HGB 9.3*  HCT 27.9*  MCV 76.0*  PLT 268   Basic Metabolic Panel:  Recent Labs Lab 09/27/16 1408  NA 136  K 3.2*  CL 103  CO2 24  GLUCOSE 151*  BUN 35*  CREATININE 1.58*  CALCIUM 8.6*   GFR: Estimated Creatinine Clearance: 27 mL/min (by C-G formula based on SCr of 1.58 mg/dL (H)). Liver Function Tests: No results for input(s): AST, ALT, ALKPHOS, BILITOT, PROT, ALBUMIN in the last 168 hours. No results for input(s): LIPASE, AMYLASE in the last 168 hours. No results for input(s): AMMONIA in the last 168 hours. Coagulation Profile:  Recent Labs Lab 09/27/16 1408  INR 1.06   Cardiac Enzymes: No results for input(s): CKTOTAL, CKMB, CKMBINDEX, TROPONINI in the last 168 hours. BNP (last 3 results) No results for input(s): PROBNP in the last 8760 hours. HbA1C: No results for input(s): HGBA1C in the last 72 hours. CBG: No results for input(s): GLUCAP in the last 168 hours. Lipid Profile: No results for input(s): CHOL, HDL, LDLCALC, TRIG, CHOLHDL, LDLDIRECT in the last 72 hours. Thyroid Function Tests: No results for input(s): TSH, T4TOTAL, FREET4, T3FREE, THYROIDAB in the last 72 hours. Anemia Panel: No results for input(s): VITAMINB12, FOLATE, FERRITIN, TIBC, IRON, RETICCTPCT in the last 72 hours. Urine analysis:    Component Value Date/Time   COLORURINE YELLOW 08/07/2016 0209   APPEARANCEUR CLEAR 08/07/2016 0209   LABSPEC 1.013 08/07/2016 0209   PHURINE 5.0 08/07/2016 0209   GLUCOSEU NEGATIVE 08/07/2016 0209   HGBUR NEGATIVE 08/07/2016 0209   BILIRUBINUR NEGATIVE 08/07/2016 0209   KETONESUR NEGATIVE 08/07/2016 0209   PROTEINUR NEGATIVE 08/07/2016 0209   UROBILINOGEN 1.0  08/27/2010 1438   NITRITE NEGATIVE 08/07/2016 0209   LEUKOCYTESUR SMALL (A) 08/07/2016 0209   Sepsis Labs: !!!!!!!!!!!!!!!!!!!!!!!!!!!!!!!!!!!!!!!!!!!! @LABRCNTIP (procalcitonin:4,lacticidven:4) )No results found for this or any previous visit (from the past 240 hour(s)).   Radiological Exams on Admission: Dg Chest 2 View  Result Date: 09/27/2016 CLINICAL DATA:  Left-sided chest pain for 2 days. EXAM: CHEST  2 VIEW COMPARISON:  08/08/2016 FINDINGS: The cardiac silhouette, mediastinal and hilar contours are within normal limits and stable. Mild tortuosity of the thoracic aorta mainly due to thoracic scoliosis. Streak E bibasilar subsegmental atelectasis and slightly low lung volumes but no infiltrates, edema or effusions. No worrisome pulmonary lesions. IMPRESSION: Streaky bibasilar atelectasis but no infiltrates or effusions. Electronically Signed   By: Rudie MeyerP.  Gallerani M.D.   On: 09/27/2016 14:23    EKG: Independently reviewed. Sinus at 74/min.   Assessment/Plan Principal Problem:  NSTEMI (non-ST elevated myocardial infarction) (HCC) Active Problems:   DM type 2 (diabetes mellitus, type 2) (HCC)   CAD (coronary artery disease)   Chest pain  NSTEMI/ ? Myocarditis/ pericarditis: Admitted to stepdown, started on IV heparin . Ibuprofen, and colchicine started for pericarditis. Her chest pain improved after she was given IV nitrates. Her EKG shows NSR at 74/min.  Started her on aspirin.  Cardiology consulted by EDP, recommended transfer to [facility] , but pt refused to be transferred, and wanted to be discharged in the next 24 hours if possible.    Hypertension: Well controlled.    Diabetes mellitus: CBG (last 3)  No results for input(s): GLUCAP in the last 72 hours.  Start SSI, hgba1c.   Anemia, microcytic: Anemia panel ordered.  Stool for occult blood ordered.    Hyperlipidemia: Lipid panel ordered.   Hypokalemia: repleted.    H/o nephrectomy, stage 3 CKD, slight worsening  of renal parameters. Gentle hydration.  Repeat renal parameters in am.     DVT prophylaxis: IV heparin.  Code Status: full code.  Family Communication: none at bedside.  Disposition Plan:pending further eval.  Consults called: cardiology Admission status: SDU, inpatient.    Kathlen Mody MD [facility] Pager [phone]  If 7PM-7AM, please contact night-coverage [email or web address] Password [facility]  09/27/2016, 6:24 PM

## 2016-09-28 ENCOUNTER — Inpatient Hospital Stay (HOSPITAL_COMMUNITY): Payer: Medicare PPO

## 2016-09-28 DIAGNOSIS — R079 Chest pain, unspecified: Secondary | ICD-10-CM

## 2016-09-28 LAB — GLUCOSE, CAPILLARY
GLUCOSE-CAPILLARY: 138 mg/dL — AB (ref 65–99)
GLUCOSE-CAPILLARY: 172 mg/dL — AB (ref 65–99)
Glucose-Capillary: 118 mg/dL — ABNORMAL HIGH (ref 65–99)
Glucose-Capillary: 113 mg/dL — ABNORMAL HIGH (ref 65–99)

## 2016-09-28 LAB — LIPID PANEL
CHOL/HDL RATIO: 2.8 ratio
CHOLESTEROL: 122 mg/dL (ref 0–200)
HDL: 43 mg/dL (ref >40)
LDL Cholesterol: 67 mg/dL (ref 0–99)
Triglycerides: 61 mg/dL (ref <150)
VLDL: 12 mg/dL (ref 0–40)

## 2016-09-28 LAB — FERRITIN: FERRITIN: 63 ng/mL (ref 11–307)

## 2016-09-28 LAB — BASIC METABOLIC PANEL
ANION GAP: 7 (ref 5–15)
BUN: 29 mg/dL — ABNORMAL HIGH (ref 6–20)
CALCIUM: 8.5 mg/dL — AB (ref 8.9–10.3)
CHLORIDE: 108 mmol/L (ref 101–111)
CO2: 25 mmol/L (ref 22–32)
Creatinine, Ser: 1.31 mg/dL — ABNORMAL HIGH (ref 0.44–1.00)
GFR calc non Af Amer: 38 mL/min — ABNORMAL LOW (ref >60)
GFR, EST AFRICAN AMERICAN: 44 mL/min — AB (ref >60)
GLUCOSE: 101 mg/dL — AB (ref 65–99)
POTASSIUM: 4 mmol/L (ref 3.5–5.1)
Sodium: 140 mmol/L (ref 135–145)

## 2016-09-28 LAB — IRON AND TIBC
IRON: 12 ug/dL — AB (ref 28–170)
Saturation Ratios: 4 % — ABNORMAL LOW (ref 10.4–31.8)
TIBC: 277 ug/dL (ref 250–450)
UIBC: 265 ug/dL

## 2016-09-28 LAB — RETICULOCYTES
RBC.: 3.38 MIL/uL — ABNORMAL LOW (ref 3.87–5.11)
RETIC COUNT ABSOLUTE: 30.4 10*3/uL (ref 19.0–186.0)
Retic Ct Pct: 0.9 % (ref 0.4–3.1)

## 2016-09-28 LAB — ECHOCARDIOGRAM COMPLETE
HEIGHTINCHES: 65.5 in
WEIGHTICAEL: 2064 [oz_av]

## 2016-09-28 LAB — VITAMIN B12: Vitamin B-12: 1069 pg/mL — ABNORMAL HIGH (ref 180–914)

## 2016-09-28 LAB — FOLATE: FOLATE: 37.1 ng/mL (ref >5.9)

## 2016-09-28 LAB — TROPONIN I: Troponin I: 0.57 ng/mL (ref <0.03)

## 2016-09-28 MED ORDER — FERROUS SULFATE 325 (65 FE) MG PO TABS
325.0000 mg | ORAL_TABLET | Freq: Two times a day (BID) | ORAL | Status: DC
  Administered 2016-09-28 – 2016-09-30 (×4): 325 mg via ORAL
  Filled 2016-09-28 (×4): qty 1

## 2016-09-28 MED ORDER — SENNOSIDES-DOCUSATE SODIUM 8.6-50 MG PO TABS
2.0000 | ORAL_TABLET | Freq: Two times a day (BID) | ORAL | Status: DC
  Administered 2016-09-28 – 2016-09-30 (×3): 2 via ORAL
  Filled 2016-09-28 (×3): qty 2

## 2016-09-28 NOTE — Progress Notes (Signed)
ANTICOAGULATION CONSULT NOTE - Follow Up Consult  Pharmacy Consult for Heparin Indication: chest pain/ACS  Allergies  Allergen Reactions  . Amoxicillin     REACTION: unspecified  . Penicillins     Has patient had a PCN reaction causing immediate rash, facial/tongue/throat swelling, SOB or lightheadedness with hypotension: yes  Has patient had a PCN reaction causing severe rash involving mucus membranes or skin necrosis: NO Has patient had a PCN reaction that required hospitalization  Has patient had a PCN reaction occurring within the last 10 years: NO If all of the above answers are "NO", then may proceed with Cephalosporin use.    Patient Measurements: Height: 5' 5.5" (166.4 cm) Weight: 129 lb (58.5 kg) IBW/kg (Calculated) : 58.15 Heparin Dosing Weight:   Vital Signs: BP: 117/36 (10/27 2300) Pulse Rate: 69 (10/27 2300)  Labs:  Recent Labs  09/27/16 1408 09/27/16 1952 09/27/16 2303  HGB 9.3*  --   --   HCT 27.9*  --   --   PLT 268  --   --   LABPROT 13.8  --   --   INR 1.06  --   --   HEPARINUNFRC  --   --  0.18*  CREATININE 1.58*  --   --   TROPONINI  --  0.91* 0.76*    Estimated Creatinine Clearance: 27 mL/min (by C-G formula based on SCr of 1.58 mg/dL (H)).   Medications:  Infusions:  . heparin 850 Units/hr (09/28/16 0100)    Assessment: Patient with low heparin level.  No heparin issues per RN.  Goal of Therapy:  Heparin level 0.3-0.7 units/ml Monitor platelets by anticoagulation protocol: Yes   Plan:  Increase heparin to 850 units/hr Recheck level at 0900  Darlina GuysGrimsley Jr, Jacquenette ShoneJulian Crowford 09/28/2016,2:02 AM

## 2016-09-28 NOTE — Progress Notes (Signed)
PROGRESS NOTE    Dawn StagersBarbara E Curtis  UJW:[id number] DOB: 09/17/1936 DOA: 09/27/2016 PCP: No PCP Per Patient    Brief Narrative: Dawn Curtis is a 80 y.o. female with medical history significant of CAD, s/p PCI, hypertension, diabetes Mellitus, came in for chest pain. Admitted for myo pericarditis.   Assessment & Plan:   Principal Problem:   NSTEMI (non-ST elevated myocardial infarction) (HCC) Active Problems:   DM type 2 (diabetes mellitus, type 2) (HCC)   CAD (coronary artery disease)   Chest pain   Acute myo pericarditis: Improving, started on ibuprofen and colchicine by cardiology.  Stopped IV heparin.  Pain improved.    Diabetes mellitus: CBG (last 3)   Recent Labs  09/28/16 0811 09/28/16 1221 09/28/16 1708  GLUCAP 118* 113* 138*    Resume SSI.  Not on meds.  hgba1c pending.     Hypertension: Controlled.    Hypokalemia: repleted.  Repeat level is normal.    Iron deficiency anemia:  Iron supplements added.    DVT prophylaxis: heparin.  Code Status: (Full/) Family Communication: none at bedside.  Disposition Plan: possibly hom ein am.    Consultants:  Cardiology.   Procedures:   none  Antimicrobials: none.   Subjective: Reports chest pain is better.   Objective: Vitals:   09/28/16 1200 09/28/16 1300 09/28/16 1400 09/28/16 1410  BP: (!) 121/39   (!) 121/51  Pulse: 63 81 73 66  Resp: 19 (!) 21 (!) 23 14  Temp:      TempSrc:      SpO2: 97% 100% 100% 99%  Weight:      Height:        Intake/Output Summary (Last 24 hours) at 09/28/16 1536 Last data filed at 09/28/16 1400  Gross per 24 hour  Intake           709.15 ml  Output              600 ml  Net           109.15 ml   Filed Weights   09/27/16 1502  Weight: 58.5 kg (129 lb)    Examination:  General exam: Appears calm and comfortable  Respiratory system: Clear to auscultation. Respiratory effort normal. Cardiovascular system: S1 & S2 heard, RRR. No JVD Systolic  murmer heard.  Gastrointestinal system: Abdomen is nondistended, soft and nontender. No organomegaly or masses felt. Normal bowel sounds heard. Central nervous system: Alert and oriented. No focal neurological deficits. Extremities: Symmetric 5 x 5 power. Skin: No rashes, lesions or ulcers Psychiatry: Judgement and insight appear normal. Mood & affect appropriate.     Data Reviewed: I have personally reviewed following labs and imaging studies  CBC:  Recent Labs Lab 09/27/16 1408  WBC 8.7  HGB 9.3*  HCT 27.9*  MCV 76.0*  PLT 268   Basic Metabolic Panel:  Recent Labs Lab 09/27/16 1408 09/28/16 0705  NA 136 140  K 3.2* 4.0  CL 103 108  CO2 24 25  GLUCOSE 151* 101*  BUN 35* 29*  CREATININE 1.58* 1.31*  CALCIUM 8.6* 8.5*   GFR: Estimated Creatinine Clearance: 32.5 mL/min (by C-G formula based on SCr of 1.31 mg/dL (H)). Liver Function Tests: No results for input(s): AST, ALT, ALKPHOS, BILITOT, PROT, ALBUMIN in the last 168 hours. No results for input(s): LIPASE, AMYLASE in the last 168 hours. No results for input(s): AMMONIA in the last 168 hours. Coagulation Profile:  Recent Labs Lab 09/27/16 1408  INR 1.06  Cardiac Enzymes:  Recent Labs Lab 09/27/16 1952 09/27/16 2303 09/28/16 0705  TROPONINI 0.91* 0.76* 0.57*   BNP (last 3 results) No results for input(s): PROBNP in the last 8760 hours. HbA1C: No results for input(s): HGBA1C in the last 72 hours. CBG:  Recent Labs Lab 09/27/16 2200 09/28/16 0811 09/28/16 1221  GLUCAP 266* 118* 113*   Lipid Profile:  Recent Labs  09/28/16 0705  CHOL 122  HDL 43  LDLCALC 67  TRIG 61  CHOLHDL 2.8   Thyroid Function Tests: No results for input(s): TSH, T4TOTAL, FREET4, T3FREE, THYROIDAB in the last 72 hours. Anemia Panel:  Recent Labs  09/28/16 0705  VITAMINB12 1,069*  FOLATE 37.1  FERRITIN 63  TIBC 277  IRON 12*  RETICCTPCT 0.9   Sepsis Labs: No results for input(s): PROCALCITON,  LATICACIDVEN in the last 168 hours.  Recent Results (from the past 240 hour(s))  MRSA PCR Screening     Status: None   Collection Time: 09/27/16  7:22 PM  Result Value Ref Range Status   MRSA by PCR NEGATIVE NEGATIVE Final    Comment:        The GeneXpert MRSA Assay (FDA approved for NASAL specimens only), is one component of a comprehensive MRSA colonization surveillance program. It is not intended to diagnose MRSA infection nor to guide or monitor treatment for MRSA infections.          Radiology Studies: Dg Chest 2 View  Result Date: 09/27/2016 CLINICAL DATA:  Left-sided chest pain for 2 days. EXAM: CHEST  2 VIEW COMPARISON:  08/08/2016 FINDINGS: The cardiac silhouette, mediastinal and hilar contours are within normal limits and stable. Mild tortuosity of the thoracic aorta mainly due to thoracic scoliosis. Streak E bibasilar subsegmental atelectasis and slightly low lung volumes but no infiltrates, edema or effusions. No worrisome pulmonary lesions. IMPRESSION: Streaky bibasilar atelectasis but no infiltrates or effusions. Electronically Signed   By: Rudie MeyerP.  Gallerani M.D.   On: 09/27/2016 14:23        Scheduled Meds: . colchicine  0.6 mg Oral BID  . ibuprofen  400 mg Oral TID  . insulin aspart  0-5 Units Subcutaneous QHS  . insulin aspart  0-9 Units Subcutaneous TID WC   Continuous Infusions:    LOS: 1 day    Time spent: 25 minutes.     Dawn Curtis,Dawn Cruces, MD [facility] Pager [phone]  If 7PM-7AM, please contact night-coverage [email or web address] Password [email or web address] 09/28/2016, 3:36 PM

## 2016-09-28 NOTE — Progress Notes (Signed)
Patient Name: Dawn StagersBarbara E Levenhagen Date of Encounter: 09/28/2016  Hospital Problem List     Principal Problem:   NSTEMI (non-ST elevated myocardial infarction) [facility](HCC) Active Problems:   DM type 2 (diabetes mellitus, type 2) (HCC)   CAD (coronary artery disease)   Chest pain    Patient Profile     80 y/o AA female with h/o CAD, HTN, and DM, presenting to the ED with complaint of chest pain who has ruled in for NSTEMI with initial POC troponin of 1.13.  Subjective   She says that she is feeling well.  Only mild soreness today.   Inpatient Medications    . colchicine  0.6 mg Oral BID  . ibuprofen  400 mg Oral TID  . insulin aspart  0-5 Units Subcutaneous QHS  . insulin aspart  0-9 Units Subcutaneous TID WC  . potassium chloride  40 mEq Oral BID    Vital Signs    Vitals:   09/28/16 0300 09/28/16 0400 09/28/16 0500 09/28/16 0600  BP: (!) 101/45 (!) 104/42 (!) 103/45 (!) 115/50  Pulse: 63 66 64 65  Resp: 19 18 17 17   Temp:  98.1 F (36.7 C)    TempSrc:  Oral    SpO2: 97% 96% 97% 97%  Weight:      Height:        Intake/Output Summary (Last 24 hours) at 09/28/16 0749 Last data filed at 09/28/16 0600  Gross per 24 hour  Intake           112.75 ml  Output              600 ml  Net          -487.25 ml   Filed Weights   09/27/16 1502  Weight: 129 lb (58.5 kg)    Physical Exam    GEN: Well nourished, well developed, in no acute distress.  Neck: Supple, no JVD, carotid bruits, or masses. Cardiac: RRR, no rubs, or gallops. No clubbing, cyanosis, no edema.  Radials/DP/PT 2+ and equal bilaterally.  Respiratory:  Respirations  regular and unlabored, clear to auscultation bilaterally. GI: Soft, nontender, nondistended, BS + x 4. Neuro:  Strength and sensation are intact.   Labs    CBC  Recent Labs  09/27/16 1408  WBC 8.7  HGB 9.3*  HCT 27.9*  MCV 76.0*  PLT 268   Basic Metabolic Panel  Recent Labs  09/27/16 1408  NA 136  K 3.2*  CL 103  CO2 24    GLUCOSE 151*  BUN 35*  CREATININE 1.58*  CALCIUM 8.6*   Liver Function Tests No results for input(s): AST, ALT, ALKPHOS, BILITOT, PROT, ALBUMIN in the last 72 hours. No results for input(s): LIPASE, AMYLASE in the last 72 hours. Cardiac Enzymes  Recent Labs  09/27/16 1952 09/27/16 2303  TROPONINI 0.91* 0.76*   BNP Invalid input(s): POCBNP D-Dimer No results for input(s): DDIMER in the last 72 hours. Hemoglobin A1C No results for input(s): HGBA1C in the last 72 hours. Fasting Lipid Panel No results for input(s): CHOL, HDL, LDLCALC, TRIG, CHOLHDL, LDLDIRECT in the last 72 hours. Thyroid Function Tests No results for input(s): TSH, T4TOTAL, T3FREE, THYROIDAB in the last 72 hours.  Invalid input(s): FREET3  Telemetry    NSR  ECG    NA  Radiology    Dg Chest 2 View  Result Date: 09/27/2016 CLINICAL DATA:  Left-sided chest pain for 2 days. EXAM: CHEST  2 VIEW COMPARISON:  08/08/2016 FINDINGS:  The cardiac silhouette, mediastinal and hilar contours are within normal limits and stable. Mild tortuosity of the thoracic aorta mainly due to thoracic scoliosis. Streak E bibasilar subsegmental atelectasis and slightly low lung volumes but no infiltrates, edema or effusions. No worrisome pulmonary lesions. IMPRESSION: Streaky bibasilar atelectasis but no infiltrates or effusions. Electronically Signed   By: Rudie MeyerP.  Gallerani M.D.   On: 09/27/2016 14:23    Assessment & Plan     NSTEMI:    Thought to be myopericarditis.  Trop trending down.  Echo is pending.  NTG discontinued.  She can move to telemetry.  Discontinue IV Heparin.   Continue colchicine.   HTN:  BP is well controlled.   CKD:  Creat is pending for this morning.       Signed, Rollene RotundaJames Kenyada Dosch, MD  09/28/2016, 7:49 AM

## 2016-09-28 NOTE — Progress Notes (Signed)
*  PRELIMINARY RESULTS* Echocardiogram 2D Echocardiogram has been performed.  Jeryl Columbialliott, Melva Faux 09/28/2016, 2:41 PM

## 2016-09-29 LAB — GLUCOSE, CAPILLARY
GLUCOSE-CAPILLARY: 119 mg/dL — AB (ref 65–99)
GLUCOSE-CAPILLARY: 127 mg/dL — AB (ref 65–99)
Glucose-Capillary: 90 mg/dL (ref 65–99)
Glucose-Capillary: 131 mg/dL — ABNORMAL HIGH (ref 65–99)

## 2016-09-29 LAB — HEMOGLOBIN A1C
Hgb A1c MFr Bld: 6.5 % — ABNORMAL HIGH (ref 4.8–5.6)
Mean Plasma Glucose: 140 mg/dL

## 2016-09-29 MED ORDER — FERROUS SULFATE 325 (65 FE) MG PO TABS: 325.0000 mg | ORAL_TABLET | Freq: Two times a day (BID) | ORAL | 3 refills | Status: AC

## 2016-09-29 MED ORDER — SENNOSIDES-DOCUSATE SODIUM 8.6-50 MG PO TABS: 2.0000 | ORAL_TABLET | Freq: Every evening | ORAL | 0 refills | Status: AC | PRN

## 2016-09-29 MED ORDER — IBUPROFEN 400 MG PO TABS: 400.0000 mg | ORAL_TABLET | Freq: Three times a day (TID) | ORAL | 0 refills | Status: DC

## 2016-09-29 MED ORDER — COLCHICINE 0.6 MG PO TABS: 0.6000 mg | ORAL_TABLET | Freq: Two times a day (BID) | ORAL | 0 refills | Status: DC

## 2016-09-29 MED ORDER — HYDROCORTISONE 1 % EX CREA
TOPICAL_CREAM | Freq: Two times a day (BID) | CUTANEOUS | Status: DC | PRN
  Administered 2016-09-29 (×2): via TOPICAL
  Filled 2016-09-29: qty 28

## 2016-09-29 NOTE — Progress Notes (Signed)
Patient Name: Dawn StagersBarbara E Curtis Date of Encounter: 09/29/2016  Hospital Problem List     Principal Problem:   NSTEMI (non-ST elevated myocardial infarction) [facility](HCC) Active Problems:   DM type 2 (diabetes mellitus, type 2) (HCC)   CAD (coronary artery disease)   Chest pain    Patient Profile     80 y/o AA female with h/o CAD, HTN, and DM, presenting to the ED with complaint of chest pain who has ruled in for NSTEMI with initial POC troponin of 1.13.  Subjective   She says that she is feeling well.  No chest pain.    Inpatient Medications    . colchicine  0.6 mg Oral BID  . ferrous sulfate  325 mg Oral BID WC  . ibuprofen  400 mg Oral TID  . insulin aspart  0-5 Units Subcutaneous QHS  . insulin aspart  0-9 Units Subcutaneous TID WC  . senna-docusate  2 tablet Oral BID    Vital Signs    Vitals:   09/28/16 1700 09/28/16 1737 09/28/16 2231 09/29/16 0457  BP:  (!) 138/57 (!) 126/55 (!) 143/56  Pulse: 68 63 64 (!) 58  Resp: (!) 21 18 16 16   Temp:  97.7 F (36.5 C) 98.4 F (36.9 C) 98.2 F (36.8 C)  TempSrc:  Oral Oral Oral  SpO2: 100% 100% 99% 100%  Weight:      Height:        Intake/Output Summary (Last 24 hours) at 09/29/16 0953 Last data filed at 09/28/16 1700  Gross per 24 hour  Intake              410 ml  Output                0 ml  Net              410 ml   Filed Weights   09/27/16 1502  Weight: 129 lb (58.5 kg)    Physical Exam    GEN: Well nourished, well developed, in no acute distress.  Neck: Supple, no JVD, carotid bruits, or masses. Cardiac: RRR, no rubs, or gallops. No clubbing, cyanosis, no edema.  Radials/DP/PT 2+ and equal bilaterally.  Respiratory:  Respirations  regular and unlabored, clear to auscultation bilaterally. GI: Soft, nontender, nondistended, BS + x 4. Neuro:  Strength and sensation are intact.   Labs    CBC  Recent Labs  09/27/16 1408  WBC 8.7  HGB 9.3*  HCT 27.9*  MCV 76.0*  PLT 268   Basic Metabolic  Panel  Recent Labs  16/09/9609/27/17 1408 09/28/16 0705  NA 136 140  K 3.2* 4.0  CL 103 108  CO2 24 25  GLUCOSE 151* 101*  BUN 35* 29*  CREATININE 1.58* 1.31*  CALCIUM 8.6* 8.5*   Liver Function Tests No results for input(s): AST, ALT, ALKPHOS, BILITOT, PROT, ALBUMIN in the last 72 hours. No results for input(s): LIPASE, AMYLASE in the last 72 hours. Cardiac Enzymes  Recent Labs  09/27/16 1952 09/27/16 2303 09/28/16 0705  TROPONINI 0.91* 0.76* 0.57*   BNP Invalid input(s): POCBNP D-Dimer No results for input(s): DDIMER in the last 72 hours. Hemoglobin A1C No results for input(s): HGBA1C in the last 72 hours. Fasting Lipid Panel  Recent Labs  09/28/16 0705  CHOL 122  HDL 43  LDLCALC 67  TRIG 61  CHOLHDL 2.8   Thyroid Function Tests No results for input(s): TSH, T4TOTAL, T3FREE, THYROIDAB in the last 72 hours.  Invalid  input(s): FREET3  Telemetry    NSR  ECHO      Normal LV size with mild LV hypertrophy. EF 60-65%. Moderate   diastolic dysfunction. Normal RV size and systolic function. No   significant valvular abnormalities. Mild pulmonary hypertension.  Radiology    Dg Chest 2 View  Result Date: 09/27/2016 CLINICAL DATA:  Left-sided chest pain for 2 days. EXAM: CHEST  2 VIEW COMPARISON:  08/08/2016 FINDINGS: The cardiac silhouette, mediastinal and hilar contours are within normal limits and stable. Mild tortuosity of the thoracic aorta mainly due to thoracic scoliosis. Streak E bibasilar subsegmental atelectasis and slightly low lung volumes but no infiltrates, edema or effusions. No worrisome pulmonary lesions. IMPRESSION: Streaky bibasilar atelectasis but no infiltrates or effusions. Electronically Signed   By: Rudie MeyerP.  Gallerani M.D.   On: 09/27/2016 14:23    Assessment & Plan     ELEVATED TROPONIN:    Thought to be myopericarditis.  Echo with unremarkable results as above.     Continue colchicine.   We would like to see her in the office next week for  probable stress testing given the elevated troponin.  [facility]  She needs to see her PCP next week and see a cardiologist as soon as possible to follow up.  She needs a copy of her discharge summary to take with her.    HTN:  BP is controlled.   Continue current meds.   CKD:  Creat was improved.  No change in therapy.         Signed, Rollene RotundaJames Evynn Boutelle, MD  09/29/2016, 9:53 AM

## 2016-09-29 NOTE — Progress Notes (Signed)
Pixie CasinoJ. Kim, MD made aware of pt's c/o itchy rash to R upper back. 4 small raised bumps noted to upper back. Will continue to monitor pt closely. Mardene CelesteAsaro, Sherl Yzaguirre I

## 2016-09-29 NOTE — Progress Notes (Signed)
PROGRESS NOTE    Dawn Curtis  ZOX:[id number] DOB: 03/19/1936 DOA: 09/27/2016 PCP: No PCP Per Patient    Brief Narrative: Dawn Curtis is a 80 y.o. female with medical history significant of CAD, s/p PCI, hypertension, diabetes Mellitus, came in for chest pain. Admitted for myo pericarditis.   Assessment & Plan:   Principal Problem:   NSTEMI (non-ST elevated myocardial infarction) (HCC) Active Problems:   DM type 2 (diabetes mellitus, type 2) (HCC)   CAD (coronary artery disease)   Chest pain   Acute myo pericarditis:  Chest pain almost resolved . recomemnded to get out of bed and ambulate.  started on ibuprofen and colchicine by cardiology.  Stopped IV heparin.     Diabetes mellitus: CBG (last 3)   Recent Labs  09/29/16 0800 09/29/16 1203 09/29/16 1706  GLUCAP 119* 127* 90    Resume SSI.  Not on meds.  hgba1c is 6.5. Pt reports she does not want to be on on any  Medications.     Hypertension: Slightly sub optimal. Resume home meds.    Hypokalemia: repleted.  Repeat level is normal.    Iron deficiency anemia:  Iron supplements added.   Stage 3 CKD: Creatinine improving, repeat in am, if stable. Can go home in am.    DVT prophylaxis: heparin.  Code Status: (Full/) Family Communication: none at bedside.  Disposition Plan: possibly home in am.    Consultants:  Cardiology.   Procedures:   none  Antimicrobials: none.   Subjective: Reports chest pain is better.   Objective: Vitals:   09/28/16 1737 09/28/16 2231 09/29/16 0457 09/29/16 1443  BP: (!) 138/57 (!) 126/55 (!) 143/56 (!) 148/50  Pulse: 63 64 (!) 58 (!) 52  Resp: 18 16 16 18   Temp: 97.7 F (36.5 C) 98.4 F (36.9 C) 98.2 F (36.8 C) 97.5 F (36.4 C)  TempSrc: Oral Oral Oral Oral  SpO2: 100% 99% 100% 99%  Weight:      Height:        Intake/Output Summary (Last 24 hours) at 09/29/16 1719 Last data filed at 09/29/16 1300  Gross per 24 hour  Intake               480 ml  Output                0 ml  Net              480 ml   Filed Weights   09/27/16 1502  Weight: 58.5 kg (129 lb)    Examination:  General exam: Appears calm and comfortable  Respiratory system: Clear to auscultation. Respiratory effort normal. Cardiovascular system: S1 & S2 heard, RRR. No JVD Systolic murmer heard.  Gastrointestinal system: Abdomen is nondistended, soft and nontender. No organomegaly or masses felt. Normal bowel sounds heard. Central nervous system: Alert and oriented. No focal neurological deficits. Extremities: Symmetric 5 x 5 power. Skin: No rashes, lesions or ulcers Psychiatry: Judgement and insight appear normal. Mood & affect appropriate.     Data Reviewed: I have personally reviewed following labs and imaging studies  CBC:  Recent Labs Lab 09/27/16 1408  WBC 8.7  HGB 9.3*  HCT 27.9*  MCV 76.0*  PLT 268   Basic Metabolic Panel:  Recent Labs Lab 09/27/16 1408 09/28/16 0705  NA 136 140  K 3.2* 4.0  CL 103 108  CO2 24 25  GLUCOSE 151* 101*  BUN 35* 29*  CREATININE 1.58* 1.31*  CALCIUM 8.6*  8.5*   GFR: Estimated Creatinine Clearance: 32.5 mL/min (by C-G formula based on SCr of 1.31 mg/dL (H)). Liver Function Tests: No results for input(s): AST, ALT, ALKPHOS, BILITOT, PROT, ALBUMIN in the last 168 hours. No results for input(s): LIPASE, AMYLASE in the last 168 hours. No results for input(s): AMMONIA in the last 168 hours. Coagulation Profile:  Recent Labs Lab 09/27/16 1408  INR 1.06   Cardiac Enzymes:  Recent Labs Lab 09/27/16 1952 09/27/16 2303 09/28/16 0705  TROPONINI 0.91* 0.76* 0.57*   BNP (last 3 results) No results for input(s): PROBNP in the last 8760 hours. HbA1C:  Recent Labs  09/27/16 2303  HGBA1C 6.5*   CBG:  Recent Labs Lab 09/28/16 1708 09/28/16 2228 09/29/16 0800 09/29/16 1203 09/29/16 1706  GLUCAP 138* 172* 119* 127* 90   Lipid Profile:  Recent Labs  09/28/16 0705  CHOL 122  HDL 43   LDLCALC 67  TRIG 61  CHOLHDL 2.8   Thyroid Function Tests: No results for input(s): TSH, T4TOTAL, FREET4, T3FREE, THYROIDAB in the last 72 hours. Anemia Panel:  Recent Labs  09/28/16 0705  VITAMINB12 1,069*  FOLATE 37.1  FERRITIN 63  TIBC 277  IRON 12*  RETICCTPCT 0.9   Sepsis Labs: No results for input(s): PROCALCITON, LATICACIDVEN in the last 168 hours.  Recent Results (from the past 240 hour(s))  MRSA PCR Screening     Status: None   Collection Time: 09/27/16  7:22 PM  Result Value Ref Range Status   MRSA by PCR NEGATIVE NEGATIVE Final    Comment:        The GeneXpert MRSA Assay (FDA approved for NASAL specimens only), is one component of a comprehensive MRSA colonization surveillance program. It is not intended to diagnose MRSA infection nor to guide or monitor treatment for MRSA infections.          Radiology Studies: No results found.      Scheduled Meds: . colchicine  0.6 mg Oral BID  . ferrous sulfate  325 mg Oral BID WC  . ibuprofen  400 mg Oral TID  . insulin aspart  0-5 Units Subcutaneous QHS  . insulin aspart  0-9 Units Subcutaneous TID WC  . senna-docusate  2 tablet Oral BID   Continuous Infusions:    LOS: 2 days    Time spent: 25 minutes.     Kathlen ModyAKULA,Lucyle Alumbaugh, MD [facility] Pager [phone]  If 7PM-7AM, please contact night-coverage [email or web address] Password [email or web address] 09/29/2016, 5:19 PM

## 2016-09-30 LAB — BASIC METABOLIC PANEL
Anion gap: 9 (ref 5–15)
BUN: 28 mg/dL — AB (ref 6–20)
CALCIUM: 8.8 mg/dL — AB (ref 8.9–10.3)
CO2: 21 mmol/L — AB (ref 22–32)
Chloride: 108 mmol/L (ref 101–111)
Creatinine, Ser: 1.48 mg/dL — ABNORMAL HIGH (ref 0.44–1.00)
GFR calc Af Amer: 38 mL/min — ABNORMAL LOW (ref >60)
GFR, EST NON AFRICAN AMERICAN: 33 mL/min — AB (ref >60)
GLUCOSE: 124 mg/dL — AB (ref 65–99)
Potassium: 4.8 mmol/L (ref 3.5–5.1)
Sodium: 138 mmol/L (ref 135–145)

## 2016-09-30 LAB — GLUCOSE, CAPILLARY
Glucose-Capillary: 128 mg/dL — ABNORMAL HIGH (ref 65–99)
Glucose-Capillary: 115 mg/dL — ABNORMAL HIGH (ref 65–99)

## 2016-09-30 MED ORDER — COLCHICINE 0.6 MG PO TABS: 0.6000 mg | ORAL_TABLET | Freq: Two times a day (BID) | ORAL | 0 refills | Status: AC

## 2016-09-30 MED ORDER — LISINOPRIL-HYDROCHLOROTHIAZIDE 10-12.5 MG PO TABS: 1.0000 | ORAL_TABLET | Freq: Every day | ORAL | 1 refills | Status: AC

## 2016-09-30 MED ORDER — IBUPROFEN 400 MG PO TABS: 400.0000 mg | ORAL_TABLET | Freq: Three times a day (TID) | ORAL | 0 refills | Status: AC

## 2016-09-30 NOTE — Discharge Summary (Signed)
Physician Discharge Summary  Dawn Curtis HQI:[id number] DOB: 01-Oct-1936 DOA: 09/27/2016  PCP: No PCP Per Patient  Admit date: 09/27/2016 Discharge date: 09/30/2016  Admitted From: Home.  Disposition:  Home.   Recommendations for Outpatient Follow-up:  1. Follow up with PCP in 1-2 weeks 2. Please obtain BMP/CBC in one week 3. Please follow up with cardiology at [facility] as soon as possible.    Discharge Condition:stable.  CODE STATUS:full code.  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 Cardiology consulted and she was started on IV nitro gtt and IV heparin gtt. Later on thery were discontinued as the presentation was consistent with myopericarditis. She was started on colchicine and ibuprofen. Her chest pain resolved and she will be discharged on 3 months of colchicine and 2 to 3 weeks of ibuprofen. She was recommended to see a cardiologist on discharge as soon as possible and get an outpatient stress test.   Discharge Diagnoses:  Principal Problem:   NSTEMI (non-ST elevated myocardial infarction) (HCC) Active Problems:   DM type 2 (diabetes mellitus, type 2) (HCC)   CAD (coronary artery disease)   Chest pain  NSTEMI/ CHEST PAIN; Her symptoms and elevated troponin's were probably from myopericarditis, echocardiogram not significant. Discontinued IV heparin and IV nitroglycerin.  Currently chest pain is resolved, recommended to continue with colchicine and ibuprofen.    Hypertension; resume home anti hypertensive's.    Stage 3 CKD: Remains stable.  Outpatient follow up with nephrology as needed.   Diabetes mellitus:  hgba1c is 6.5  Pt currently not interested in medications.  Recommend outpatient follow up with PCP for medications.    CAD; S/P  PCI at [facility],  Follow up with cardiology at [facility], as she plans to move to [facility] in the next two days.      Discharge Instructions  Discharge Instructions    Diet - low sodium heart healthy    Complete by:  As directed    Diet - low sodium heart healthy    Complete by:  As directed    Discharge instructions    Complete by:  As directed    Please follow up with cardiology at [place], [place], (where she had her stents done).   Discharge instructions    Complete by:  As directed    Please follow up with your cardiologist at [facility]. You will need a stress test as outpatient with cardiology.       Medication List    TAKE these medications   colchicine 0.6 MG tablet Take 1 tablet (0.6 mg total) by mouth 2 (two) times daily.   cyanocobalamin 500 MCG tablet Take 1,000 mcg by mouth daily.   ferrous sulfate 325 (65 FE) MG tablet Take 1 tablet (325 mg total) by mouth 2 (two) times daily with a meal.   GARLIC PO Take 3 capsules by mouth daily.   ibuprofen 400 MG tablet Commonly known as:  ADVIL,MOTRIN Take 1 tablet (400 mg total) by mouth 3 (three) times daily.   L-Glutamine 500 MG Caps Take 500 mg by mouth daily.   lisinopril-hydrochlorothiazide 10-12.5 MG tablet Commonly known as:  PRINZIDE,ZESTORETIC Take 1 tablet by mouth daily.   ondansetron 8 MG disintegrating tablet Commonly known as:  ZOFRAN ODT 8mg  ODT q8 hours prn  nausea   PROBIOTIC PO Take 1 capsule by mouth daily.   senna-docusate 8.6-50 MG tablet Commonly known as:  Senokot-S Take 2 tablets by mouth at bedtime as needed for mild constipation.   vitamin C 1000 MG tablet Take 1,000 mg by mouth daily.       Allergies  Allergen Reactions  . Amoxicillin     REACTION: unspecified  . Penicillins     Has patient had a PCN reaction causing immediate rash, facial/tongue/throat swelling, SOB or lightheadedness with hypotension: yes  Has patient had a PCN reaction causing severe rash involving  mucus membranes or skin necrosis: NO Has patient had a PCN reaction that required hospitalization  Has patient had a PCN reaction occurring within the last 10 years: NO If all of the above answers are "NO", then may proceed with Cephalosporin use.    Consultations:  cardiology   Procedures/Studies: Dg Chest 2 View  Result Date: 09/27/2016 CLINICAL DATA:  Left-sided chest pain for 2 days. EXAM: CHEST  2 VIEW COMPARISON:  08/08/2016 FINDINGS: The cardiac silhouette, mediastinal and hilar contours are within normal limits and stable. Mild tortuosity of the thoracic aorta mainly due to thoracic scoliosis. Streak E bibasilar subsegmental atelectasis and slightly low lung volumes but no infiltrates, edema or effusions. No worrisome pulmonary lesions. IMPRESSION: Streaky bibasilar atelectasis but no infiltrates or effusions. Electronically Signed   By: Rudie MeyerP.  Gallerani M.D.   On: 09/27/2016 14:23    ECHOCARDIOGRAM.    Subjective:  No chest pain today.  Discharge Exam: Vitals:   09/29/16 2138 09/30/16 0603  BP: 138/64 (!) 147/56  Pulse: 61 (!) 54  Resp: 18 18  Temp: 97.7 F (36.5 C) 97.9 F (36.6 C)   Vitals:   09/29/16 0457 09/29/16 1443 09/29/16 2138 09/30/16 0603  BP: (!) 143/56 (!) 148/50 138/64 (!) 147/56  Pulse: (!) 58 (!) 52 61 (!) 54  Resp: 16 18 18 18   Temp: 98.2 F (36.8 C) 97.5 F (36.4 C) 97.7 F (36.5 C) 97.9 F (36.6 C)  TempSrc: Oral Oral Oral Oral  SpO2: 100% 99% 100% 100%  Weight:      Height:        General: Pt is alert, awake, not in acute distress Cardiovascular: RRR, S1/S2 +, no rubs, no gallops Respiratory: CTA bilaterally, no wheezing, no rhonchi Abdominal: Soft, NT, ND, bowel sounds + Extremities: no edema, no cyanosis    The results of significant diagnostics from this hospitalization (including imaging, microbiology, ancillary and laboratory) are listed below for reference.     Microbiology: Recent Results (from the past 240 hour(s))  MRSA  PCR Screening     Status: None   Collection Time: 09/27/16  7:22 PM  Result Value Ref Range Status   MRSA by PCR NEGATIVE NEGATIVE Final    Comment:        The GeneXpert MRSA Assay (FDA approved for NASAL specimens only), is one component of a comprehensive MRSA colonization surveillance program. It is not intended to diagnose MRSA infection nor to guide or monitor treatment for MRSA infections.      Labs: BNP (last 3 results) No results for input(s): BNP in the last 8760 hours. Basic Metabolic Panel:  Recent Labs Lab 09/27/16 1408 09/28/16 0705 09/30/16 0347  NA 136 140 138  K 3.2* 4.0 4.8  CL 103 108 108  CO2 24 25 21*  GLUCOSE 151* 101* 124*  BUN 35* 29* 28*  CREATININE 1.58* 1.31* 1.48*  CALCIUM 8.6* 8.5* 8.8*  Liver Function Tests: No results for input(s): AST, ALT, ALKPHOS, BILITOT, PROT, ALBUMIN in the last 168 hours. No results for input(s): LIPASE, AMYLASE in the last 168 hours. No results for input(s): AMMONIA in the last 168 hours. CBC:  Recent Labs Lab 09/27/16 1408  WBC 8.7  HGB 9.3*  HCT 27.9*  MCV 76.0*  PLT 268   Cardiac Enzymes:  Recent Labs Lab 09/27/16 1952 09/27/16 2303 09/28/16 0705  TROPONINI 0.91* 0.76* 0.57*   BNP: Invalid input(s): POCBNP CBG:  Recent Labs Lab 09/29/16 1203 09/29/16 1706 09/29/16 2148 09/30/16 0757 09/30/16 1143  GLUCAP 127* 90 131* 128* 115*   D-Dimer No results for input(s): DDIMER in the last 72 hours. Hgb A1c  Recent Labs  09/27/16 2303  HGBA1C 6.5*   Lipid Profile  Recent Labs  09/28/16 0705  CHOL 122  HDL 43  LDLCALC 67  TRIG 61  CHOLHDL 2.8   Thyroid function studies No results for input(s): TSH, T4TOTAL, T3FREE, THYROIDAB in the last 72 hours.  Invalid input(s): FREET3 Anemia work up  Recent Labs  09/28/16 0705  VITAMINB12 1,069*  FOLATE 37.1  FERRITIN 63  TIBC 277  IRON 12*  RETICCTPCT 0.9   Urinalysis    Component Value Date/Time   COLORURINE YELLOW  08/07/2016 0209   APPEARANCEUR CLEAR 08/07/2016 0209   LABSPEC 1.013 08/07/2016 0209   PHURINE 5.0 08/07/2016 0209   GLUCOSEU NEGATIVE 08/07/2016 0209   HGBUR NEGATIVE 08/07/2016 0209   BILIRUBINUR NEGATIVE 08/07/2016 0209   KETONESUR NEGATIVE 08/07/2016 0209   PROTEINUR NEGATIVE 08/07/2016 0209   UROBILINOGEN 1.0 08/27/2010 1438   NITRITE NEGATIVE 08/07/2016 0209   LEUKOCYTESUR SMALL (A) 08/07/2016 0209   Sepsis Labs Invalid input(s): PROCALCITONIN,  WBC,  LACTICIDVEN Microbiology Recent Results (from the past 240 hour(s))  MRSA PCR Screening     Status: None   Collection Time: 09/27/16  7:22 PM  Result Value Ref Range Status   MRSA by PCR NEGATIVE NEGATIVE Final    Comment:        The GeneXpert MRSA Assay (FDA approved for NASAL specimens only), is one component of a comprehensive MRSA colonization surveillance program. It is not intended to diagnose MRSA infection nor to guide or monitor treatment for MRSA infections.      Time coordinating discharge: Over 30 minutes  SIGNED:   Kathlen ModyAKULA,Kristian Mogg, MD  [facility] 09/30/2016, 12:36 PM Pager   If 7PM-7AM, please contact night-coverage [email or web address] Password [email or web address]

## 2016-09-30 NOTE — Progress Notes (Signed)
Patient Name: Dawn StagersBarbara E Perlstein Date of Encounter: 09/30/2016  Primary Cardiologist[facility]  Hospital Problem List     Principal Problem:   NSTEMI (non-ST elevated myocardial infarction) [facility](HCC) Active Problems:   DM type 2 (diabetes mellitus, type 2) (HCC)   CAD (coronary artery disease)   Chest pain     Subjective   Feeling much better. No chest pain or SOB. Arthritis pain also better and wonders if helped by colchicine  Inpatient Medications    Scheduled Meds: . colchicine  0.6 mg Oral BID  . ferrous sulfate  325 mg Oral BID WC  . ibuprofen  400 mg Oral TID  . insulin aspart  0-5 Units Subcutaneous QHS  . insulin aspart  0-9 Units Subcutaneous TID WC  . senna-docusate  2 tablet Oral BID   Continuous Infusions:   PRN Meds: hydrocortisone cream   Vital Signs    Vitals:   09/29/16 0457 09/29/16 1443 09/29/16 2138 09/30/16 0603  BP: (!) 143/56 (!) 148/50 138/64 (!) 147/56  Pulse: (!) 58 (!) 52 61 (!) 54  Resp: 16 18 18 18   Temp: 98.2 F (36.8 C) 97.5 F (36.4 C) 97.7 F (36.5 C) 97.9 F (36.6 C)  TempSrc: Oral Oral Oral Oral  SpO2: 100% 99% 100% 100%  Weight:      Height:        Intake/Output Summary (Last 24 hours) at 09/30/16 0809 Last data filed at 09/30/16 0600  Gross per 24 hour  Intake             2080 ml  Output                4 ml  Net             2076 ml   Filed Weights   09/27/16 1502  Weight: 129 lb (58.5 kg)    Physical Exam   GEN: Well nourished, well developed, in no acute distress.  HEENT: Grossly normal.  Neck: Supple, no JVD, carotid bruits, or masses. Cardiac: RRR, no murmurs, rubs, or gallops. No clubbing, cyanosis, edema.  Radials/DP/PT 2+ and equal bilaterally.  Respiratory:  Respirations regular and unlabored, clear to auscultation bilaterally. GI: Soft, nontender, nondistended, BS + x 4. MS: no deformity or atrophy. Skin: warm and dry, no rash. Neuro:  Strength and sensation are intact. Psych: AAOx3.  Normal  affect.  Labs    CBC  Recent Labs  09/27/16 1408  WBC 8.7  HGB 9.3*  HCT 27.9*  MCV 76.0*  PLT 268   Basic Metabolic Panel  Recent Labs  09/28/16 0705 09/30/16 0347  NA 140 138  K 4.0 4.8  CL 108 108  CO2 25 21*  GLUCOSE 101* 124*  BUN 29* 28*  CREATININE 1.31* 1.48*  CALCIUM 8.5* 8.8*   Liver Function Tests No results for input(s): AST, ALT, ALKPHOS, BILITOT, PROT, ALBUMIN in the last 72 hours. No results for input(s): LIPASE, AMYLASE in the last 72 hours. Cardiac Enzymes  Recent Labs  09/27/16 1952 09/27/16 2303 09/28/16 0705  TROPONINI 0.91* 0.76* 0.57*   BNP Invalid input(s): POCBNP D-Dimer No results for input(s): DDIMER in the last 72 hours. Hemoglobin A1C  Recent Labs  09/27/16 2303  HGBA1C 6.5*   Fasting Lipid Panel  Recent Labs  09/28/16 0705  CHOL 122  HDL 43  LDLCALC 67  TRIG 61  CHOLHDL 2.8   Thyroid Function Tests No results for input(s): TSH, T4TOTAL, T3FREE, THYROIDAB in the last 72  hours.  Invalid input(s): FREET3  Telemetry    Sinus bradycardia - Personally Reviewed  ECG    normal sinus rhythm and early repolarization - Personally Reviewed  Radiology    No results found.  Cardiac Studies   2D ECHO: 09/28/2016 LV EF: 60% -   65% Study Conclusions - Left ventricle: The cavity size was normal. Wall thickness was   increased in a pattern of mild LVH. Systolic function was normal.   The estimated ejection fraction was in the range of 60% to 65%.   Wall motion was normal; there were no regional wall motion   abnormalities. Features are consistent with a pseudonormal left   ventricular filling pattern, with concomitant abnormal relaxation   and increased filling pressure (grade 2 diastolic dysfunction). - Aortic valve: There was no stenosis. - Mitral valve: There was trivial regurgitation. - Left atrium: The atrium was mildly dilated. - Right ventricle: The cavity size was normal. Systolic function   was  normal. - Tricuspid valve: Peak RV-RA gradient (S): 31 mm Hg. - Pulmonary arteries: PA peak pressure: 39 mm Hg (S). - Systemic veins: IVC measured 2.4 cm with > 50% respirophasic   variation, suggesting RA pressure 8 mmHg. Impressions: - Normal LV size with mild LV hypertrophy. EF 60-65%. Moderate   diastolic dysfunction. Normal RV size and systolic function. No   significant valvular abnormalities. Mild pulmonary hypertension.   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   Assessment & Plan   Chest pain: she presented with 3 days of URI, chest heaviness and sharp chest pain when she lays in bed and takes deep breath. She had a 2/6 systolic murmur and loud systolic and diastolic rub. Presentation and PE felt to be most c/w myopericarditis. Heparin gtt discontinued when troponin trending down (0.76-->0.57). Started on colchicine 0.6mg  BID and Ibuprofen 400 TID. 2D ECHO with normal LV function, mild LVH,  G2DD and PA pressure 39. Plan was for outpatient nuclear stress test but she is going back to [place]. She has been advised to follow up with her PCP and cardiologist there. She will require a copy of her discharge summary. Would continue NSAIDS for 2 weeks and colchicine x 3 months.    CAD: status post stenting of an unknown artery 3 years ago in [place]. Not on ASA, BB or statin at home or here? HR likely too low for BB.   CKD: s/p nephrectomy for a congenital kidney problem in her childhood. Creat 1.48  DMT2: continue home regimen. HgA1c 6.5  HTN: BP mildly elevated today. She has baseline bradycardia precluding use of BB. Would resume her home Lisinopril-HCTZ    Signed, Cline CrockKathryn Thompson, PA-C  09/30/2016, 8:09 AM   As above, patient seen and examined. She has not had  any further chest pain or dyspnea. Her initial presentation is felt to be possibly secondary to myopericarditis. Her chest pain increased with lying flat and inspiration. She can be discharged from a cardiac standpoint. [place]. Given history of coronary artery disease she should be on aspirin and statin long-term. Please call with questions.  Olga MillersBrian Crenshaw, MD

## 2016-09-30 NOTE — Progress Notes (Signed)
Completed D/C teaching. Gave prescriptions. Answered all questions. Patient will be D/C home with family in stable condition.

## 2016-09-30 NOTE — Progress Notes (Signed)
Pt had no HH needs at discharge.

## 2018-10-27 LAB — LIPID PANEL
Chol/HDL Ratio: 4.4 (ref 0.0–4.4)
Cholesterol: 210 mg/dL — ABNORMAL HIGH (ref 100–200)
HDL: 48 mg/dL — ABNORMAL LOW (ref 65–96)
LDL Cholesterol: 137.6 mg/dL — ABNORMAL HIGH (ref 0.0–100.0)
LDL/HDL Ratio: 2.9
Triglycerides: 122 mg/dL (ref 0–149)
VLDL: 24.4 mg/dL (ref 5.0–40.0)

## 2018-10-27 LAB — CBC
Hematocrit: 33.4 % — ABNORMAL LOW (ref 34.0–47.0)
Hemoglobin: 11.4 g/dL — ABNORMAL LOW (ref 11.5–15.7)
MCH: 27.9 pg (ref 27.0–34.5)
MCHC: 34 g/dL (ref 32.0–36.0)
MCV: 82.1 fL (ref 81.0–99.0)
MPV: 8.7 fL (ref 7.2–13.2)
Platelets: 193 10*3/uL (ref 140–440)
RBC: 4.07 x10e6/mcL (ref 3.60–5.20)
RDW: 14.4 % (ref 11.0–16.0)
WBC: 5 10*3/uL (ref 3.8–10.6)

## 2018-10-27 LAB — TSH WITH REFLEX TO FT4: TSH: 2.64 mcIU/mL (ref 0.358–3.740)

## 2018-10-27 LAB — COMPREHENSIVE METABOLIC PANEL
ALT: 9 U/L (ref 0–33)
AST: 19 U/L (ref 0–32)
Albumin/Globulin Ratio: 1.5 mmol/L (ref 1.00–2.00)
Albumin: 4.4 g/dL (ref 3.5–5.2)
Alk Phosphatase: 125 U/L — ABNORMAL HIGH (ref 35–117)
Anion Gap: 16 mmol/L (ref 2–17)
BUN: 23 mg/dL (ref 8–23)
CO2: 21 mmol/L — ABNORMAL LOW (ref 22–29)
Calcium: 9.3 mg/dL (ref 8.8–10.2)
Chloride: 104 mmol/L (ref 98–107)
Creatinine: 1.3 mg/dL — ABNORMAL HIGH (ref 0.5–0.9)
GFR African American: 45 mL/min/{1.73_m2} — ABNORMAL LOW (ref >=90)
GFR Non-African American: 39 mL/min/{1.73_m2} — ABNORMAL LOW (ref >=90)
Globulin: 3 g/dL (ref 1.9–4.4)
Glucose: 102 mg/dL — ABNORMAL HIGH (ref 70–99)
OSMOLALITY CALCULATED: 285 mOsm/kg (ref 270–287)
Potassium: 4.4 mmol/L (ref 3.5–5.3)
Sodium: 141 mmol/L (ref 135–145)
Total Bilirubin: 0.4 mg/dL (ref 0.00–1.20)
Total Protein: 7.4 g/dL (ref 6.4–8.3)

## 2018-10-27 LAB — HEMOGLOBIN A1C
Est. Avg. Glucose, WB: 123
Est. Avg. Glucose-calculated: 133
Hemoglobin A1C: 5.9 % (ref 4.0–6.0)

## 2018-10-27 LAB — RHEUMATOID FACTOR: Rheumatoid Factor Quant: <10 IU/mL (ref 0.0–19.0)

## 2018-10-28 LAB — SEDIMENTATION RATE: Sed Rate: 19 mm/hr (ref 0–30)

## 2018-10-29 LAB — ANA: ANA by IFA: NEGATIVE

## 2018-10-29 LAB — CYCLIC CITRUL PEPTIDE ANTIBODY, IGG: CCP Antibodies IgG/IgA: 201 units — ABNORMAL HIGH (ref 0–19)

## 2018-12-10 LAB — BASIC METABOLIC PANEL
Anion Gap: 12 mmol/L (ref 2–17)
BUN: 22 mg/dL (ref 8–23)
CO2: 27 mmol/L (ref 22–29)
Calcium: 9.8 mg/dL (ref 8.8–10.2)
Chloride: 99 mmol/L (ref 98–107)
Creatinine: 1.4 mg/dL — ABNORMAL HIGH (ref 0.5–0.9)
GFR African American: 41 mL/min/{1.73_m2} — ABNORMAL LOW (ref >=90)
GFR Non-African American: 35 mL/min/{1.73_m2} — ABNORMAL LOW (ref >=90)
Glucose: 113 mg/dL — ABNORMAL HIGH (ref 70–99)
OSMOLALITY CALCULATED: 280 mOsm/kg (ref 270–287)
Potassium: 4.8 mmol/L (ref 3.5–5.3)
Sodium: 138 mmol/L (ref 135–145)

## 2018-12-10 LAB — IRON AND TIBC
Iron Saturation: 15 % — ABNORMAL LOW (ref 20–40)
Iron: 50 ug/dL (ref 37–145)
TIBC: 340 ug/dL (ref 250–450)
UIBC: 290 ug/dL (ref 112.0–347.0)

## 2018-12-10 LAB — VITAMIN B12: Vitamin B-12: >2000 pg/mL — ABNORMAL HIGH (ref 232–1245)

## 2018-12-10 LAB — FOLATE: Folate: >20 ng/mL (ref 4.80–24.20)

## 2018-12-11 LAB — CREATININE CLEARANCE
Creatinine Clearance: 20.6 mL/min — ABNORMAL LOW (ref 92.0–132.0)
Creatinine, 24H Ur: 0.5 g/(24.h) — ABNORMAL LOW (ref 0.6–1.5)
Creatinine, Ur: 43 mg/dL (ref 28.0–217.0)
Creatinine: 1.5 mg/dL — ABNORMAL HIGH (ref 0.5–0.9)
GFR African American: 38 mL/min/{1.73_m2} — ABNORMAL LOW (ref >=90)
GFR Non-African American: 33 mL/min/{1.73_m2} — ABNORMAL LOW (ref >=90)
Height, Inches: 65 Inch
SURAREACALC: 2
Weight in Pounds: 135

## 2018-12-11 LAB — VOLUME, URINE: 24H Urine Volume: 1200 mL

## 2021-05-25 LAB — CBC
Hematocrit: 12.2 % — CL (ref 34.0–47.0)
Hemoglobin: 4.3 g/dL — CL (ref 11.5–15.7)
MCH: 28.5 pg (ref 27.0–34.5)
MCHC: 35.2 g/dL (ref 32.0–36.0)
MCV: 80.8 fL — ABNORMAL LOW (ref 81.0–99.0)
MPV: 10.9 fL (ref 7.2–13.2)
NRBC Absolute: 0 10*3/uL (ref 0.000–0.012)
NRBC Automated: 0 % (ref 0.0–0.2)
Platelets: 102 10*3/uL — ABNORMAL LOW (ref 140–440)
RBC: 1.51 x10e6/mcL — ABNORMAL LOW (ref 3.60–5.20)
RDW: 13.1 % (ref 11.0–16.0)
WBC: 1.6 10*3/uL — CL (ref 3.8–10.6)

## 2021-05-25 LAB — COMPREHENSIVE METABOLIC PANEL
ALT: 14 U/L (ref 0–35)
AST: 25 U/L (ref 0–35)
Albumin/Globulin Ratio: 1.9 (ref 1.00–2.70)
Albumin: 4.5 g/dL (ref 3.5–5.2)
Alk Phosphatase: 106 U/L (ref 35–117)
Anion Gap: 14 mmol/L (ref 2–17)
BUN: 23 mg/dL (ref 8–23)
Bun/Cre Ratio: 15.3 (ref 6.0–17.0)
CO2: 23 mmol/L (ref 22–29)
Calcium: 9.2 mg/dL (ref 8.8–10.2)
Chloride: 101 mmol/L (ref 98–107)
Creatinine: 1.5 mg/dL — ABNORMAL HIGH (ref 0.5–1.0)
Est, Glom Filt Rate: 34 mL/min/1.73m?? — ABNORMAL LOW (ref >=90)
Globulin: 2.4 g/dL (ref 1.9–4.4)
Glucose: 83 mg/dL (ref 70–99)
OSMOLALITY CALCULATED: 278 mOsm/kg (ref 270–287)
Potassium: 4.1 mmol/L (ref 3.5–5.3)
Sodium: 138 mmol/L (ref 135–145)
Total Bilirubin: 0.78 mg/dL (ref 0.00–1.20)
Total Protein: 6.9 g/dL (ref 6.4–8.3)

## 2021-05-25 LAB — LIPID PANEL
Chol/HDL Ratio: 4.1 (ref 0.0–4.4)
Cholesterol: 213 mg/dL — ABNORMAL HIGH (ref 100–200)
HDL: 52 mg/dL (ref >=50)
LDL Cholesterol: 138.8 mg/dL — ABNORMAL HIGH (ref 0.0–100.0)
LDL/HDL Ratio: 2.7
Triglycerides: 111 mg/dL (ref 0–149)
VLDL: 22.2 mg/dL (ref 5.0–40.0)

## 2021-05-25 LAB — IMMATURE CELLS
IMMATURE PLT ABSOLUTE: 1.7 10*3/uL
IMMATURE PLT PERCENT: 1.7 % (ref 1.2–8.6)

## 2021-09-25 ENCOUNTER — Encounter: Attending: Family Medicine | Primary: Family Medicine

## 2021-10-18 NOTE — Telephone Encounter (Signed)
April Mays called in stating they did a [employer] screening and it was abnormal to the right and left leg. Right leg was 0.27 and left leg was 0.24 which are both considered server. Bridget requested Dr. Lorin Picket f/u with the patient at the time of her appt next week. They will be sending the results to the office but the results wont be at the office before the pt has her appt.     (When looking I do not see an upcoming appt for next week scheduled, please advise)

## 2021-10-19 ENCOUNTER — Ambulatory Visit: Admit: 2021-10-19 | Discharge: 2021-10-19 | Payer: MEDICAID | Attending: Physician Assistant | Primary: Family Medicine

## 2021-10-19 DIAGNOSIS — I739 Peripheral vascular disease, unspecified: Secondary | ICD-10-CM

## 2021-10-19 MED ORDER — FLUTICASONE PROPIONATE 50 MCG/ACT NA SUSP
50 MCG/ACT | Freq: Two times a day (BID) | NASAL | 1 refills | Status: AC
Start: 2021-10-19 — End: ?

## 2021-10-19 MED ORDER — AZELASTINE HCL 0.1 % NA SOLN
0.1 % | Freq: Two times a day (BID) | NASAL | 1 refills | Status: AC
Start: 2021-10-19 — End: ?

## 2021-10-19 NOTE — Telephone Encounter (Signed)
Joni Reining from [facility] called in to ask for the last office notes for this patient.   Fax number: [phone]  Phone Number: [phone]

## 2021-10-19 NOTE — Telephone Encounter (Signed)
Noreene Larsson is out today, lvm to schedule with Jaclyn at 9am this morning.

## 2021-10-22 NOTE — Progress Notes (Signed)
April Mays (DOB:  [birth year]) is a 85 y.o. female,Established patient, here for evaluation of the following chief complaint(s):  Other (Circulation issues)      Vitals:    10/19/21 1044   BP: 138/76   Pulse: 87   SpO2: 99%   Weight: 125 lb (56.7 kg)   Height: 5\' 5"  (1.651 m)           ASSESSMENT/PLAN:  1. Peripheral artery disease (HCC)  Comments:  new  reviewed notes she has brought in today (scanned)  discussed risks associated wtih PAD - referral sent to vasc  Orders:  -     , MD - Vascular Surgery  2. Throat congestion  Comments:  new  discussed trial of nasal steroid and antihistamine  call with new or worsening symptoms or if no improvement in 3-4 weeks  Orders:  -     fluticasone (FLONASE) 50 MCG/ACT nasal spray; 1 spray by Each Nostril route 2 times daily, Disp-16 g, R-1Normal  -     azelastine (ASTELIN) 0.1 % nasal spray; 1 spray by Nasal route 2 times daily Use in each nostril as directed, Disp-30 mL, R-1Normal  3. Hypertension, unspecified type  -     CBC; Future  -     Comprehensive Metabolic Panel; Future  4. Other hyperlipidemia  -     Lipid Panel; Future  5. Other specified diabetes mellitus with other circulatory complication, without long-term current use of insulin (HCC)  -     Hemoglobin A1C; Future      Return for follow up w/ Dr. Gwendolyn Grant or Lorin Picket.       Orders Placed This Encounter    CBC     Standing Status:   Future     Standing Expiration Date:   10/19/2022    Comprehensive Metabolic Panel     Standing Status:   Future     Standing Expiration Date:   10/19/2022    Hemoglobin A1C     Standing Status:   Future     Standing Expiration Date:   10/19/2022    Lipid Panel     Standing Status:   Future     Standing Expiration Date:   10/19/2022    10/21/2022, MD - Vascular Surgery     Referral Priority:   Routine     Referral Type:   Eval and Treat     Referral Reason:   Specialty Services Required     Referred to Provider:   Gwendolyn Grant, MD     Requested Specialty:    Vascular Surgery     Number of Visits Requested:   1    lisinopril-hydroCHLOROthiazide (PRINZIDE;ZESTORETIC) 10-12.5 MG per tablet     Sig: 1 tabs, Oral, Daily, 30 tabs, 0, 06/16/18 19:58:00 EDT, Tab    fluticasone (FLONASE) 50 MCG/ACT nasal spray     Sig: 1 spray by Each Nostril route 2 times daily     Dispense:  16 g     Refill:  1    azelastine (ASTELIN) 0.1 % nasal spray     Sig: 1 spray by Nasal route 2 times daily Use in each nostril as directed     Dispense:  30 mL     Refill:  1      CURRENT MEDS W/ ASSOC DIAG           Start Date End Date     azelastine (ASTELIN) 0.1 % nasal spray  10/19/21  --  1 spray by Nasal route 2 times daily Use in each nostril as directed     Associated Diagnoses:  Purulent postnasal drainage     fluticasone (FLONASE) 50 MCG/ACT nasal spray  10/19/21  --     1 spray by Each Nostril route 2 times daily     Associated Diagnoses:  Purulent postnasal drainage     lisinopril (PRINIVIL;ZESTRIL) 10 MG tablet  --  --     Associated Diagnoses:  --     lisinopril-hydroCHLOROthiazide (PRINZIDE;ZESTORETIC) 10-12.5 MG per tablet  06/16/18  --     Associated Diagnoses:  --     rosuvastatin (CRESTOR) 20 MG tablet  --  --     Associated Diagnoses:  --             Subjective   SUBJECTIVE/OBJECTIVE:  85 yo female with history of type 2 diabetes, hypertension, and CAD is here to discuss recent ABI score. During her wellness visit at home yesterday she had a [employer] PAD test done, ABI right leg was 0.27 and left leg was 0.24. She admits to coldness of lower legs. Denies pain in lower extremities with activity.  She would also like to discuss increased mucus in her throat over the last several months. She states she always feels she needs to spit after eating and has skipped several dinner parties recently because of it. She denies rhinitis, shortness of breath, and cough.               Objective   Physical Exam  Constitutional:       Appearance: Normal appearance.   HENT:      Nose: Nose normal.       Mouth/Throat:      Mouth: Mucous membranes are moist.      Pharynx: Oropharynx is clear. No oropharyngeal exudate or posterior oropharyngeal erythema.   Eyes:      Extraocular Movements: Extraocular movements intact.      Conjunctiva/sclera: Conjunctivae normal.   Pulmonary:      Breath sounds: Normal breath sounds.   Neurological:      Mental Status: She is alert and oriented to person, place, and time.   Psychiatric:         Mood and Affect: Mood normal.         Thought Content: Thought content normal.                 No results found for any previous visit.      No results found for this visit on 10/19/21.     Allergies   Allergen Reactions    Penicillins Itching     Other reaction(s): Unknown    Penicillin G      Event:        Prior to Admission medications    Medication Sig Start Date End Date Taking? Authorizing Provider   lisinopril-hydroCHLOROthiazide (PRINZIDE;ZESTORETIC) 10-12.5 MG per tablet 1 tabs, Oral, Daily, 30 tabs, 0, 06/16/18 19:58:00 EDT, Tab 06/16/18  Yes Historical Provider, MD   fluticasone (FLONASE) 50 MCG/ACT nasal spray 1 spray by Each Nostril route 2 times daily 10/19/21  Yes Melvern Ramone R Aynslee Mulhall, PA-C   azelastine (ASTELIN) 0.1 % nasal spray 1 spray by Nasal route 2 times daily Use in each nostril as directed 10/19/21  Yes Olen Eaves R Amy Belloso, PA-C   rosuvastatin (CRESTOR) 20 MG tablet Take 20 mg by mouth daily   Yes Historical Provider, MD   lisinopril (PRINIVIL;ZESTRIL) 10 MG tablet Take 10 mg  by mouth daily   Yes Historical Provider, MD      Family History   Problem Relation Age of Onset    Coronary Art Dis Father         Past Surgical History:   Procedure Laterality Date    CAROTID STENT  2013    @[facility] , REPEAT STUDY AND REPAIR DONE 2013    KIDNEY REMOVAL Left     CONGENTIALLY DEFORMED      Past Medical History:   Diagnosis Date    Anxiety     Bilateral club feet     DR RAVENELL/ FREQUENT Sx AGE 75-8    CAD (coronary artery disease) 01/2012    S/P MI SENT ON 2 OCCASSIONS ([facility]ARIM[facility])     Depression     Elevated serum creatinine     DUE TO NEPHRECTOMY    History of stress test 01/2012    NORMAL/ DR MANSINDET/REVERSIBLE PERFUSION DEFECT    Hx of echocardiogram 01/2012    NORMAL/ DR MASINDET    Hyperlipidemia     Hypertension     Neuropathy     Pericarditis     Tx WITH CHOLCHICINE ([place])    Type 2 diabetes mellitus without complication (HCC)                   An electronic signature was used to authenticate this note.    --01/2012, PA-C

## 2021-10-23 NOTE — Progress Notes (Signed)
She has a screening test on her legs, It showed significant narrowing of the arteries in her legs. I recommend that we have a full test done at the hospital to evaluate further

## 2021-10-24 NOTE — Telephone Encounter (Signed)
Patient called in stated she missed a call from Angie. She thinks she was calling regarding test results. We attempted to contact the office no answer. Please have Angie give the patient a call back.

## 2021-10-24 NOTE — Telephone Encounter (Signed)
done

## 2021-10-24 NOTE — Progress Notes (Addendum)
Called patient no answer, left vm to return my call. Times 2 no answer 11/28

## 2021-10-29 NOTE — Telephone Encounter (Signed)
Returned patients call no answer left voice mail to return my call.

## 2021-10-30 ENCOUNTER — Encounter

## 2021-10-30 NOTE — Progress Notes (Signed)
Ordered

## 2021-10-31 NOTE — Telephone Encounter (Signed)
Patient called back in to get advice on her flu symptoms.She was tested for the flu and this is her second day. She would like to speak to the nurse about what medications to take. I offered to schedule with Dr. Lorin Picket for tomorrow and patient declined as she would like a call back instead.

## 2021-10-31 NOTE — Telephone Encounter (Signed)
Pt is calling about having the flu can we call her back and tell her what she needs to do

## 2021-11-01 ENCOUNTER — Encounter: Payer: MEDICAID | Attending: Family Medicine | Primary: Family Medicine

## 2021-11-01 ENCOUNTER — Encounter: Attending: Family Medicine | Primary: Family Medicine

## 2021-11-05 ENCOUNTER — Encounter: Payer: MEDICARE | Attending: Family | Primary: Family Medicine

## 2021-11-07 NOTE — Telephone Encounter (Signed)
Patient returning Missed call from Angie S. Pt asked if you could please return her call at [phone]. Reached out to office, but nobody available to take call.

## 2021-11-07 NOTE — Telephone Encounter (Addendum)
Returned pt call, left message stating I was following up with her to see if she was feeling better after having the flu.

## 2021-11-07 NOTE — Telephone Encounter (Signed)
Returned pts call, no answer left vm

## 2021-11-07 NOTE — Telephone Encounter (Signed)
Called patient to follow up on phone call, no answer left vm to return my call.

## 2021-11-07 NOTE — Telephone Encounter (Signed)
Pt returned a call and would like a call back from [facility]

## 2021-11-07 NOTE — Telephone Encounter (Signed)
ERROR

## 2021-11-09 NOTE — Telephone Encounter (Signed)
Spoke to pt she is feeling much better today. She has a lot of mucus that is clear but at times green/ yellow. She sad this has been an on going issue and Dr. Lorin Picket is aware. She has nose drops for this. I advised her to increase her fluid.

## 2021-12-05 MED ORDER — LISINOPRIL 10 MG PO TABS
10 MG | ORAL_TABLET | ORAL | 1 refills | Status: DC
Start: 2021-12-05 — End: 2022-07-15

## 2021-12-05 MED ORDER — ROSUVASTATIN CALCIUM 20 MG PO TABS
20 MG | ORAL_TABLET | ORAL | 1 refills | Status: AC
Start: 2021-12-05 — End: ?

## 2022-02-14 ENCOUNTER — Encounter: Admit: 2022-02-14 | Discharge: 2022-02-14 | Payer: MEDICARE | Attending: Family | Primary: Family Medicine

## 2022-02-14 ENCOUNTER — Telehealth

## 2022-02-14 ENCOUNTER — Encounter

## 2022-02-14 DIAGNOSIS — E7849 Other hyperlipidemia: Secondary | ICD-10-CM

## 2022-02-14 NOTE — Progress Notes (Signed)
April Mays (DOB:  06/03/1936) is a 86 y.o. female, Established Patient, here for evaluation of the following chief complaint(s):  Other (Broken tooth/Gas  /Memory loss )      Vitals:    02/14/22 1028   BP: 132/62   Pulse: 65   Resp: 16   SpO2: 98%   Weight: 125 lb 8 oz (56.9 kg)   Height: 5\' 5"  (1.651 m)           ASSESSMENT/PLAN:  1. Other hyperlipidemia  2. Primary hypertension  Comments:  Stable. Will recheck labs today  3. Controlled type 2 diabetes mellitus with diabetic nephropathy, without long-term current use of insulin (HCC)  4. Closed fracture of tooth, initial encounter  Comments:  Referral to dentist to extract root of #10 and get fit for partial versus dentures  Orders:  , MD - Dental General Practice  5. Excessive flatus  Comments:  Encouraged lowFODMAP diet, handout given. Consider GI referral if no relief      Return for AWV w/Dr Lorette Ang.         Subjective   SUBJECTIVE/OBJECTIVE:  HPI  86 y.o. female presenting with c/o tooth fx and increased gas. Tooth broke off months ago and now her tissue has grown around the root. No pain in area but would like it fixed. Tooth is upper central incisor. Also c/o increased gas over the past few months. Has had a lot of mucous production that she feels is contributing. Has tried both pre and probiotics. Bowels normal. No N/V or abdominal pain. Feels like she is bloated.            Objective   Physical Exam  Constitutional:       Appearance: Normal appearance.   HENT:      Head: Normocephalic and atraumatic.      Mouth/Throat:      Mouth: Mucous membranes are moist.      Pharynx: Oropharynx is clear.      Comments: Poor dentition, only #9, #12 remaining of upper teeth  Cardiovascular:      Rate and Rhythm: Normal rate and regular rhythm.   Pulmonary:      Effort: Pulmonary effort is normal.      Breath sounds: Normal breath sounds.   Abdominal:      General: Bowel sounds are normal. There is no distension.      Palpations: Abdomen is  soft. There is no mass.      Tenderness: There is no abdominal tenderness.   Musculoskeletal:      Cervical back: Normal range of motion.   Skin:     General: Skin is warm and dry.   Neurological:      General: No focal deficit present.      Mental Status: She is alert and oriented to person, place, and time.   Psychiatric:         Mood and Affect: Mood normal.         Behavior: Behavior normal.                 No results found for any previous visit.      No results found for this visit on 02/14/22.     Allergies   Allergen Reactions    Penicillins Itching     Other reaction(s): Unknown    Penicillin G      Event:        Prior to Admission medications  Medication Sig Start Date End Date Taking? Authorizing Provider   clopidogrel (PLAVIX) 75 MG tablet Take 75 mg by mouth daily   Yes Historical Provider, MD   lisinopril (PRINIVIL;ZESTRIL) 10 MG tablet TAKE 1 TABLET BY MOUTH EVERY DAY FOR 90 DAYS 12/05/21  Yes Khyle Goodell N Wenona [place], APRN - NP   lisinopril-hydroCHLOROthiazide (PRINZIDE;ZESTORETIC) 10-12.5 MG per tablet 1 tabs, Oral, Daily, 30 tabs, 0, 06/16/18 19:58:00 EDT, Tab 06/16/18  Yes Historical Provider, MD   fluticasone (FLONASE) 50 MCG/ACT nasal spray 1 spray by Each Nostril route 2 times daily 10/19/21  Yes Megan R Christian, PA-C   azelastine (ASTELIN) 0.1 % nasal spray 1 spray by Nasal route 2 times daily Use in each nostril as directed 10/19/21  Yes Megan R Christian, PA-C   rosuvastatin (CRESTOR) 20 MG tablet TAKE 1 TABLET BY MOUTH EVERY DAY FOR 90 DAYS  Patient not taking: Reported on 02/14/2022 12/05/21   Cristal Generous, APRN - NP      Family History   Problem Relation Age of Onset    Coronary Art Dis Father       Social History     Socioeconomic History    Marital status: Widowed     Spouse name: Not on file    Number of children: Not on file    Years of education: Not on file    Highest education level: Not on file   Occupational History    Not on file   Tobacco Use    Smoking status: Never    Smokeless  tobacco: Never   Vaping Use    Vaping Use: Never used   Substance and Sexual Activity    Alcohol use: Never    Drug use: Never    Sexual activity: Not on file   Other Topics Concern    Not on file   Social History Narrative    Not on file     Social Determinants of Health     Financial Resource Strain: Not on file   Food Insecurity: Not on file   Transportation Needs: Not on file   Physical Activity: Not on file   Stress: Not on file   Social Connections: Not on file   Intimate Partner Violence: Not on file   Housing Stability: Not on file      Past Surgical History:   Procedure Laterality Date    CAROTID STENT  2013    @[facility] , REPEAT STUDY AND REPAIR DONE 2013    KIDNEY REMOVAL Left     CONGENTIALLY DEFORMED      Past Medical History:   Diagnosis Date    Anxiety     Bilateral club feet     DR RAVENELL/ FREQUENT Sx AGE 37-8    CAD (coronary artery disease) 01/2012    S/P MI SENT ON 2 OCCASSIONS ([facility])    Depression     Elevated serum creatinine     DUE TO NEPHRECTOMY    History of stress test 01/2012    NORMAL/ DR MANSINDET/REVERSIBLE PERFUSION DEFECT    Hx of echocardiogram 01/2012    NORMAL/ DR MASINDET    Hyperlipidemia     Hypertension     Neuropathy     Pericarditis     Tx WITH CHOLCHICINE ([place])    Type 2 diabetes mellitus without complication (HCC)               An electronic signature was used to authenticate this note.    --01/2012  Lamichael Youkhana, APRN - NP

## 2022-02-14 NOTE — Addendum Note (Signed)
Addended by: Cristal Generous on: 02/14/2022 01:37 PM     Modules accepted: Orders

## 2022-02-14 NOTE — Telephone Encounter (Signed)
Pt would like referral to   - Podiatrist for foot issues  &  Memory test   (requested CT/MRI Brain but Jeanette notified test should be done prior to imaging).    Please call pt and advise.     Pt scheduled for [facility] 04/10/22

## 2022-02-14 NOTE — Telephone Encounter (Signed)
Amil Amen from Dr. Lorette Ang office is calling to inform the office that Dr. Lorette Ang currently treats sleep disorders only. Please send the referral that was sent today 02/14/22 to an different office.

## 2022-02-14 NOTE — Progress Notes (Signed)
Venipuncture: 22 gauge, number of attempts: 1, lab sent to [facility] lab, patient tolerated procedure well , site: RT AC, site cleansed with aseptic technique, pressure applied to site, tubes collected: 2.

## 2022-02-15 LAB — COMPREHENSIVE METABOLIC PANEL
ALT: 22 U/L (ref 0–35)
AST: 29 U/L (ref 0–35)
Albumin/Globulin Ratio: 1.8 (ref 1.00–2.70)
Albumin: 4.5 g/dL (ref 3.5–5.2)
Alk Phosphatase: 108 U/L (ref 35–117)
Anion Gap: 8 mmol/L (ref 2–17)
BUN: 33 mg/dL — ABNORMAL HIGH (ref 8–23)
CO2: 27 mmol/L (ref 22–29)
Calcium: 9.1 mg/dL (ref 8.8–10.2)
Chloride: 103 mmol/L (ref 98–107)
Creatinine: 1.4 mg/dL — ABNORMAL HIGH (ref 0.5–1.0)
Est, Glom Filt Rate: 37 mL/min/1.73m — ABNORMAL LOW (ref >=60)
Globulin: 2.5 g/dL (ref 1.9–4.4)
Glucose: 78 mg/dL (ref 70–99)
OSMOLALITY CALCULATED: 281 mOsm/kg (ref 270–287)
Potassium: 4.9 mmol/L (ref 3.5–5.3)
Sodium: 138 mmol/L (ref 135–145)
Total Bilirubin: 0.2 mg/dL (ref 0.00–1.20)
Total Protein: 7 g/dL (ref 6.4–8.3)

## 2022-02-15 LAB — LIPID PANEL
Chol/HDL Ratio: 3.6 (ref 0.0–4.4)
Cholesterol: 189 mg/dL (ref 100–200)
HDL: 53 mg/dL (ref >=50)
LDL Cholesterol: 113.4 mg/dL — ABNORMAL HIGH (ref 0.0–100.0)
LDL/HDL Ratio: 2.1
Triglycerides: 113 mg/dL (ref 0–149)
VLDL: 22.6 mg/dL (ref 5.0–40.0)

## 2022-02-15 LAB — CBC
Hematocrit: 32 % — ABNORMAL LOW (ref 34.0–47.0)
Hemoglobin: 10.7 g/dL — ABNORMAL LOW (ref 11.5–15.7)
MCH: 28.4 pg (ref 27.0–34.5)
MCHC: 33.4 g/dL (ref 32.0–36.0)
MCV: 84.9 fL (ref 81.0–99.0)
MPV: 10.5 fL (ref 7.2–13.2)
NRBC Absolute: 0 10*3/uL (ref 0.000–0.012)
NRBC Automated: 0 % (ref 0.0–0.2)
Platelets: 191 10*3/uL (ref 140–440)
RBC: 3.77 x10e6/mcL (ref 3.60–5.20)
RDW: 13.5 % (ref 11.0–16.0)
WBC: 3.9 10*3/uL (ref 3.8–10.6)

## 2022-02-15 LAB — HEMOGLOBIN A1C
Est. Avg. Glucose, WB: 105
Est. Avg. Glucose-calculated: 111
Hemoglobin A1C: 5.3 % (ref 4.0–6.0)

## 2022-02-15 NOTE — Telephone Encounter (Signed)
Returned pt call, no answer left vm to return my call, advised office closes at noon today

## 2022-02-15 NOTE — Telephone Encounter (Signed)
Returned call no answer, left vm to return the call

## 2022-02-15 NOTE — Telephone Encounter (Signed)
Hemoglobin is 10 showing she is anemic. I'd like her to see GI to r/o any slow bleed. Start taking oral iron supplements daily if she can tolerate

## 2022-02-18 NOTE — Telephone Encounter (Signed)
Referrals placed

## 2022-02-18 NOTE — Telephone Encounter (Signed)
Addressed in separate TE

## 2022-02-18 NOTE — Telephone Encounter (Signed)
Reason for podiatry referral. Is foot fungus, pt would like a referral for GI and a CT or MRI of brain due to not remembering  words and forgetting what she is saying during a conversation and rambling on about something completely different

## 2022-02-20 ENCOUNTER — Telehealth

## 2022-02-20 NOTE — Telephone Encounter (Signed)
Stated that the pt was made aware that they do not accept her insurance, but wanted to let us know as well.

## 2022-02-20 NOTE — Telephone Encounter (Signed)
Referral to [facility] dentistry placed

## 2022-02-20 NOTE — Telephone Encounter (Signed)
April Mays from [facility] would like to advise they do not accept [employer] so they will not be able to see this patient

## 2022-02-28 NOTE — Telephone Encounter (Signed)
Spoke to pt about asking Misty Stanley to call her and possibly assist her with things she may need. Pt agreed that it would be a good idea because she is having difficulty .

## 2022-03-25 NOTE — Telephone Encounter (Signed)
Attempted to contact pt to also f/u after ER visit, no answer left vm to return my call

## 2022-03-25 NOTE — Care Coordination-Inpatient (Unsigned)
Follow up call to patient. Recent request for CCM to reach out to this patient to offer resources and follow for CCM. CCM unable to reach after several attempts and requested RN in office notify patient that trying to reach with next contact. Patient was seen in the ER on 4/19 at [facility]. Attempted to follow up with her again and no answer. Message was left.

## 2022-03-26 NOTE — Care Coordination-Inpatient (Unsigned)
Additional call and no response. See that the patient has an appointment scheduled on May 10 th.

## 2022-04-01 NOTE — Telephone Encounter (Signed)
Spoke to pt she is feeling tired and sluggish after her ER visit. Pt was scheduled to have 11 teeth removed last Tuesday 03/26/22 but it was canceled due to system being down. Pt is concerned about fatigue advised pt to f/u with dr Lorin Picket prior to teeth removal.pt verbalized an understanding  and agreered    F/U 04/10/22 pt will call if need something sooner

## 2022-04-01 NOTE — Telephone Encounter (Signed)
Spoke to pt she is feeling fatigue, she is already scheduled for 04/10/22 offered sooner appt, pt declined

## 2022-04-01 NOTE — Telephone Encounter (Signed)
Spoke to patient gave her Lattie Haw [place]'s phone number to reach out to her and also place it in pts contact list so if Lattie Haw tries to call the call will not be sent to vm

## 2022-04-10 ENCOUNTER — Encounter: Payer: MEDICARE | Attending: Family Medicine | Primary: Family Medicine

## 2022-04-15 NOTE — Progress Notes (Signed)
This encounter was created in error - please disregard.

## 2022-05-15 NOTE — Telephone Encounter (Signed)
Our records show you are late for your LIS medication refill. Please call your pharmacy to request this medicine as soon as possible. If you need refills, please call DR. SCOTT    If you have picked up your medicine already or this medicine is discontinued, please disregard.    We appreciate you partnering with [place] for your healthcare needs.    [facility]

## 2022-07-02 NOTE — Telephone Encounter (Signed)
Our records show you are late for your LIS MEDICATION refill. Please call your pharmacy to request this medicine as soon as possible. If you need refills, please call DR. SCOTT    If you have picked up your medicine already or this medicine is discontinued, please disregard.    We appreciate you partnering with [place] for your healthcare needs.    [facility]

## 2022-07-15 MED ORDER — LISINOPRIL 10 MG PO TABS
10 MG | ORAL_TABLET | ORAL | 4 refills | Status: AC
Start: 2022-07-15 — End: ?

## 2022-07-16 NOTE — Telephone Encounter (Signed)
Please call her and set her up for an appointment ASAP. She is due for an AWV and follow up on diabetes

## 2022-11-16 ENCOUNTER — Inpatient Hospital Stay: Admit: 2022-11-16 | Discharge: 2022-11-16 | Disposition: A | Discharge status: Home / Self Care | Payer: MEDICARE

## 2022-11-16 ENCOUNTER — Emergency Department: Admit: 2022-11-16 | Payer: MEDICARE | Primary: Family Medicine

## 2022-11-16 DIAGNOSIS — K5900 Constipation, unspecified: Secondary | ICD-10-CM

## 2022-11-16 LAB — CBC WITH AUTO DIFFERENTIAL
Basophils %: 1 % (ref 0.0–2.0)
Basophils Absolute: 0.1 10*3/uL (ref 0.0–0.2)
Eosinophils %: 5.5 % (ref 0.0–7.0)
Eosinophils Absolute: 0.3 10*3/uL (ref 0.0–0.5)
Hematocrit: 31.7 % — ABNORMAL LOW (ref 34.0–47.0)
Hemoglobin: 10.7 g/dL — ABNORMAL LOW (ref 11.5–15.7)
Immature Grans (Abs): 0.01 10*3/uL (ref 0.00–0.06)
Immature Granulocytes %: 0.2 % (ref 0.0–0.6)
Lymphocytes Absolute: 1.3 10*3/uL (ref 1.0–3.2)
Lymphocytes: 24.3 % (ref 15.0–45.0)
MCH: 28.8 pg (ref 27.0–34.5)
MCHC: 33.8 g/dL (ref 32.0–36.0)
MCV: 85.2 fL (ref 81.0–99.0)
MPV: 9.8 fL (ref 7.2–13.2)
Monocytes %: 8 % (ref 4.0–12.0)
Monocytes Absolute: 0.4 10*3/uL (ref 0.3–1.0)
Neutrophils %: 61 % (ref 42.0–74.0)
Neutrophils Absolute: 3.2 10*3/uL (ref 1.6–7.3)
Platelets: 203 10*3/uL (ref 140–440)
RBC: 3.72 x10e6/mcL (ref 3.60–5.20)
RDW: 13.4 % (ref 11.0–16.0)
WBC: 5.2 10*3/uL (ref 3.8–10.6)

## 2022-11-16 LAB — COMPREHENSIVE METABOLIC PANEL
ALT: 19 U/L (ref 0–35)
AST: 26 U/L (ref 0–35)
Albumin/Globulin Ratio: 1 (ref 1.00–2.70)
Albumin: 4.3 g/dL (ref 3.5–5.2)
Alk Phosphatase: 105 U/L (ref 35–117)
Anion Gap: 11 mmol/L (ref 2–17)
BUN: 34 mg/dL — ABNORMAL HIGH (ref 8–23)
CO2: 23 mmol/L (ref 22–29)
Calcium: 9.4 mg/dL (ref 8.8–10.2)
Chloride: 105 mmol/L (ref 98–107)
Creatinine: 1.5 mg/dL — ABNORMAL HIGH (ref 0.5–1.0)
Est, Glom Filt Rate: 34 mL/min/1.73m — ABNORMAL LOW (ref >=60)
Globulin: 3 g/dL (ref 1.9–4.4)
Glucose: 90 mg/dL (ref 70–99)
Osmolaliy Calculated: 284 mOsm/kg (ref 270–287)
Potassium: 5.3 mmol/L (ref 3.5–5.3)
Sodium: 139 mmol/L (ref 135–145)
Total Bilirubin: 0.37 mg/dL (ref 0.00–1.20)
Total Protein: 7.6 g/dL (ref 6.4–8.3)

## 2022-11-16 MED ORDER — SODIUM CHLORIDE 0.9 % IV BOLUS
0.9 % | Freq: Once | INTRAVENOUS | Status: DC
Start: 2022-11-16 — End: 2022-11-16

## 2022-11-16 MED ORDER — POLYETHYLENE GLYCOL 3350 17 GM/SCOOP PO POWD
17 GM/SCOOP | Freq: Two times a day (BID) | ORAL | 0 refills | Status: AC
Start: 2022-11-16 — End: 2022-12-16

## 2022-11-16 MED ORDER — SIMETHICONE 125 MG PO CAPS
125 MG | ORAL_CAPSULE | Freq: Four times a day (QID) | ORAL | 0 refills | Status: AC | PRN
Start: 2022-11-16 — End: 2022-11-23

## 2022-11-16 NOTE — Progress Notes (Signed)
Pt called and scheduled

## 2022-11-16 NOTE — Discharge Instructions (Signed)
Your lab work is stable.  Your EKG is unremarkable.,  Chest x-ray of your right ribs shows no acute findings.  Please take MiraLAX and Gas-X for your intermittent constipation and gas pain.  Please follow-up with your primary care doctor.

## 2022-11-16 NOTE — ED Provider Notes (Signed)
[facility]  EMERGENCY DEPARTMENT ENCOUNTER      Pt Name: April Mays  MRN: [id number]  [place] [birth year]  Date of evaluation: 11/16/2022  Provider: Meryl Crutch, PA-C    CHIEF COMPLAINT       Chief Complaint   Patient presents with    hypertensive     Pt states her heart rate is 60's/70's,and BP 150-160,thought these are abnormal values,also gassy for about 1 month,also some right rib pain that comes and goes       HISTORY OF PRESENT ILLNESS    HPI  86 y.o. female with a history of hypertension, hyperlipidemia, CAD, CKD presents for concerns of high heart rate and high blood pressure.  She states over the last 6 months her heart rates have been in the 60s and 70s and blood pressures 1 50-1 60.  She is compliant with her blood pressure medications.  She denies chest pain, shortness of breath, lightheadedness, syncope.  She also states for 6 months she has been very gassy and sometimes troubles with constipation.  She denies abdominal pain, nausea or vomiting, bloody or black stool, dysuria, hematuria.  She also mentions that for the last 6 months she occasionally has pain to the right side of her ribs to the lateral aspect this comes and goes and is worse when she moves her arm.  No trauma or injury.    Nursing Notes were reviewed.  REVIEW OF SYSTEMS       Review of Systems see HPI  Except as noted above the remainder of the review of systems was reviewed and negative.   PAST MEDICAL HISTORY     Past Medical History:   Diagnosis Date    Anxiety     Bilateral club feet     DR RAVENELL/ FREQUENT Sx AGE 46-8    CAD (coronary artery disease) 01/2012    S/P MI SENT ON 2 OCCASSIONS ([facility])    Depression     Elevated serum creatinine     DUE TO NEPHRECTOMY    History of stress test 01/2012    NORMAL/ DR MANSINDET/REVERSIBLE PERFUSION DEFECT    Hx of echocardiogram 01/2012    NORMAL/ DR MASINDET    Hyperlipidemia     Hypertension     Neuropathy     Pericarditis     Tx WITH CHOLCHICINE ([place])     Type 2 diabetes mellitus without complication ([place])      SURGICAL HISTORY       Past Surgical History:   Procedure Laterality Date    CAROTID STENT  2013    _0 , REPEAT STUDY AND REPAIR DONE 2013    KIDNEY REMOVAL Left     CONGENTIALLY DEFORMED     CURRENT MEDICATIONS       Discharge Medication List as of 11/16/2022  4:01 PM        CONTINUE these medications which have NOT CHANGED    Details   lisinopril (PRINIVIL;ZESTRIL) 10 MG tablet TAKE 1 TABLET BY MOUTH EVERY DAY FOR 90 DAYS, Disp-100 tablet, R-4REFILL NEEDEDNormal      ibuprofen (ADVIL;MOTRIN) 800 MG tablet TAKE 1 TABLET BY MOUTH EVERY 4 TO 6 HOURS AS NEEDEDHistorical Med      ondansetron (ZOFRAN-ODT) 4 MG disintegrating tablet TAKE 1 TABLET BY MOUTH EVERY 6 HOURS AS NEEDED FOR NAUSEA AND VOMITINGHistorical Med      triazolam (HALCION) 0.25 MG tablet TAKE 1-2 TABS 1 HOUR BEFORE ARRIVING TO APPT. MAKE SURE TO HAVE SOMEONE  DRIVE TO/FROM THE APPT.Historical Med      clopidogrel (PLAVIX) 75 MG tablet Take 75 mg by mouth dailyHistorical Med      rosuvastatin (CRESTOR) 20 MG tablet TAKE 1 TABLET BY MOUTH EVERY DAY FOR 90 DAYS, Disp-90 tablet, R-1REFILL NEEDED PLEASENormal      lisinopril-hydroCHLOROthiazide (PRINZIDE;ZESTORETIC) 10-12.5 MG per tablet 1 tabs, Oral, Daily, 30 tabs, 0, 06/16/18 19:58:00 EDT, TabHistorical Med      fluticasone (FLONASE) 50 MCG/ACT nasal spray 1 spray by Each Nostril route 2 times daily, Disp-16 g, R-1Normal      azelastine (ASTELIN) 0.1 % nasal spray 1 spray by Nasal route 2 times daily Use in each nostril as directed, Disp-30 mL, R-1Normal           ALLERGIES     Penicillins and Penicillin g  FAMILY HISTORY       Family History   Problem Relation Age of Onset    Coronary Art Dis Father       SOCIAL HISTORY       Social History     Socioeconomic History    Marital status: Widowed   Tobacco Use    Smoking status: Never    Smokeless tobacco: Never   Vaping Use    Vaping Use: Never used   Substance and Sexual Activity    Alcohol use:  Never    Drug use: Never     PHYSICAL EXAM       ED Triage Vitals [11/16/22 1410]   BP Temp Temp src Pulse Respirations SpO2 Height Weight - Scale   (!) 147/75 97.5 F (36.4 C) -- 68 16 100 % 1.651 m (_0 ) 63.5 kg (140 lb)       Physical Exam  Vitals and nursing note reviewed.   Constitutional:       Appearance: Normal appearance.   HENT:      Head: Normocephalic and atraumatic.   Cardiovascular:      Rate and Rhythm: Normal rate and regular rhythm.      Heart sounds: Normal heart sounds.   Pulmonary:      Effort: Pulmonary effort is normal.      Breath sounds: Normal breath sounds.   Chest:      Comments: Chest wall without tenderness, deformity, crepitus  Abdominal:      General: Abdomen is flat. Bowel sounds are normal.      Palpations: Abdomen is soft.      Tenderness: There is no abdominal tenderness.   Musculoskeletal:         General: Normal range of motion.   Skin:     General: Skin is warm and dry.   Neurological:      General: No focal deficit present.      Mental Status: She is alert and oriented to person, place, and time.   Psychiatric:         Mood and Affect: Mood normal.         Behavior: Behavior normal.         DIAGNOSTIC RESULTS   PROCEDURES:  Unless otherwise noted below, none     Procedures    RADIOLOGY:   Non-plain film images such as CT, Ultrasound and MRI are read by the radiologist. Plain radiographic images are visualized and preliminarily interpreted by the emergency physician with the below findings:    Interpretation per the Radiologist below, if available at the time of this note:    XR RIBS RIGHT INCLUDE CHEST (MIN 3 VIEWS)  Final Result   Negative for right rib fracture or complication thereof.          LABS:  Results for orders placed or performed during the hospital encounter of 11/16/22   CBC with Auto Differential   Result Value Ref Range    WBC 5.2 3.8 - 10.6 x10e3/mcL    RBC 3.72 3.60 - 5.20 x10e6/mcL    Hemoglobin 10.7 (L) 11.5 - 15.7 g/dL    Hematocrit 31.7 (L) 34.0 - 47.0  %    MCV 85.2 81.0 - 99.0 fL    MCH 28.8 27.0 - 34.5 pg    MCHC 33.8 32.0 - 36.0 g/dL    RDW 13.4 11.0 - 16.0 %    Platelets 203 140 - 440 x10e3/mcL    MPV 9.8 7.2 - 13.2 fL    Neutrophils % 61.0 42.0 - 74.0 %    Lymphocytes 24.3 15.0 - 45.0 %    Monocytes 8.0 4.0 - 12.0 %    Eosinophils % 5.5 0.0 - 7.0 %    Basophils % 1.0 0.0 - 2.0 %    Neutrophils Absolute 3.2 1.6 - 7.3 x10e3/mcL    Absolute Lymph # 1.3 1.0 - 3.2 x10e3/mcL    Absolute Mono # 0.4 0.3 - 1.0 x10e3/mcL    Absolute Eos # 0.3 0.0 - 0.5 x10e3/mcL    Absolute Baso # 0.1 0.0 - 0.2 x10e3/mcL    Immature Granulocytes 0.2 0.0 - 0.6 %    Immature Grans (Abs) 0.01 0.00 - 0.06 x10e3/mcL   Comprehensive Metabolic Panel   Result Value Ref Range    Sodium 139 135 - 145 mmol/L    Potassium 5.3 3.5 - 5.3 mmol/L    Chloride 105 98 - 107 mmol/L    CO2 23 22 - 29 mmol/L    Glucose 90 70 - 99 mg/dL    BUN 34 (H) 8 - 23 mg/dL    Creatinine 1.5 (H) 0.5 - 1.0 mg/dL    Anion Gap 11 2 - 17 mmol/L    OSMOLALITY CALCULATED 284 270 - 287 mOsm/kg    Calcium 9.4 8.8 - 10.2 mg/dL    Total Protein 7.6 6.4 - 8.3 g/dL    Albumin 4.3 3.5 - 5.2 g/dL    Globulin 3.0 1.9 - 4.4 g/dL    Albumin/Globulin Ratio 1.00 1.00 - 2.70    Total Bilirubin 0.37 0.00 - 1.20 mg/dL    Alk Phosphatase 105 35 - 117 unit/L    AST 26 0 - 35 unit/L    ALT 19 0 - 35 unit/L    Est, Glom Filt Rate 34 (L) >=60 mL/min/1.37m  EKG 12 Lead   Result Value Ref Range    Ventricular Rate 65 BPM    P-R Interval 130 ms    QRS Duration 82 ms    Q-T Interval 398 ms    QTc Calculation (Bazett) 409 ms    P Axis 72 degrees    R Axis 75 degrees    T Axis 90 degrees    Diagnosis       SINUS RHYTHM  NONSPECIFIC T WAVE ABNORMALITY  ABNORMAL ECG         All other labs were within normal range or not returned as of this dictation.    EMERGENCY DEPARTMENT COURSE/REASSESSMENT and MDM:   Vitals:    Vitals:    11/16/22 1410   BP: (!) 147/75   Pulse: 68   Resp: 16   Temp:  97.5 F (36.4 C)   SpO2: 100%   Weight: 63.5 kg (140 lb)    Height: 1.651 m (_0 )       ED Course:       MDM  Number of Diagnoses or Management Options  Diagnosis management comments: 86 year old female presents for evaluation, she is initially concerned that her heart rate and blood pressure have been running high but reassured that these were normal and not concerning numbers.  Advise close follow-up with her PCP and continue her blood pressure medications.  She also notes gassiness and intermittent constipation for 6 weeks but has no nausea vomiting diarrhea or abdominal pain or urinary complaints. I have low concern for concerning intra-abdominal pathology but will check basic labs and plan to treat her with MiraLAX and Gas-X.    In regards to her intermittent right lateral lower rib discomfort with movement for 6 months, will obtain rib x-rays.  This is not consistent with ACS or PE.  Her vitals are stable and she has no chest pain or shortness of breath.  Reassurance provided.        FINAL IMPRESSION      1. Constipation, unspecified constipation type    2. Flatulence    3. Rib pain on right side          DISPOSITION/PLAN   DISPOSITION Decision To Discharge 11/16/2022 04:01:28 PM      PATIENT REFERRED TO:  Salley Hews, MD  [address]  [place] [address]  [phone]          Karmen Bongo, MD  [place] [address]  [phone]            DISCHARGE MEDICATIONS:  Discharge Medication List as of 11/16/2022  4:01 PM        START taking these medications    Details   Simethicone 125 MG CAPS Take 1 capsule by mouth 4 times daily as needed (gas pain), Disp-28 capsule, R-0Normal      polyethylene glycol (GLYCOLAX) 17 GM/SCOOP powder Take 17 g by mouth 2 times daily, Disp-225 g, R-0Normal           (Please note that portions of this note were completed with a voice recognition program.  Efforts were made to edit the dictations but occasionally words are mis-transcribed.)  Meryl Crutch, PA-C (electronically  signed)             Meryl Crutch, PA-C  11/16/22 1656

## 2022-11-18 LAB — EKG 12-LEAD
P Axis: 72 degrees
P-R Interval: 130 ms
Q-T Interval: 398 ms
QRS Duration: 82 ms
QTc Calculation (Bazett): 409 ms
R Axis: 75 degrees
T Axis: 90 degrees
Ventricular Rate: 65 {beats}/min

## 2022-11-28 ENCOUNTER — Encounter: Payer: MEDICARE | Attending: Family Medicine | Primary: Family Medicine

## 2022-11-28 DIAGNOSIS — E7849 Other hyperlipidemia: Secondary | ICD-10-CM

## 2023-11-30 ENCOUNTER — Inpatient Hospital Stay
Admit: 2023-11-30 | Discharge: 2023-12-01 | Disposition: A | Discharge status: Home / Self Care | Payer: MEDICARE | Attending: Emergency Medicine

## 2023-11-30 DIAGNOSIS — I10 Essential (primary) hypertension: Secondary | ICD-10-CM

## 2023-11-30 LAB — CBC WITH AUTO DIFFERENTIAL
Basophils %: 1 % (ref 0.0–2.0)
Basophils Absolute: 0.1 10*3/uL (ref 0.0–0.2)
Eosinophils %: 6.3 % (ref 0.0–7.0)
Eosinophils Absolute: 0.3 10*3/uL (ref 0.0–0.5)
Hematocrit: 32.4 % — ABNORMAL LOW (ref 34.0–47.0)
Hemoglobin: 10.7 g/dL — ABNORMAL LOW (ref 11.5–15.7)
Immature Grans (Abs): 0.02 10*3/uL (ref 0.00–0.06)
Immature Granulocytes %: 0.4 % (ref 0.0–0.6)
Lymphocytes Absolute: 1.1 10*3/uL (ref 1.0–3.2)
Lymphocytes: 23.8 % (ref 15.0–45.0)
MCH: 27.9 pg (ref 27.0–34.5)
MCHC: 33 g/dL (ref 30.0–36.0)
MCV: 84.6 fL (ref 81.0–99.0)
MPV: 10.3 fL (ref 7.0–12.2)
Monocytes %: 9.4 % (ref 4.0–12.0)
Monocytes Absolute: 0.5 10*3/uL (ref 0.3–1.0)
Neutrophils %: 59.1 % (ref 42.0–74.0)
Neutrophils Absolute: 2.8 10*3/uL (ref 1.6–7.3)
Platelets: 172 10*3/uL (ref 140–440)
RBC: 3.83 x10e6/mcL (ref 3.60–5.20)
RDW: 14 % (ref 10.0–17.0)
WBC: 4.8 10*3/uL (ref 3.8–10.6)

## 2023-11-30 LAB — COMPREHENSIVE METABOLIC PANEL
ALT: 16 U/L (ref 0–42)
AST: 23 U/L (ref 0–46)
Albumin/Globulin Ratio: 1 (ref 1.00–2.70)
Albumin: 4 g/dL (ref 3.5–5.2)
Alk Phosphatase: 150 U/L — ABNORMAL HIGH (ref 35–117)
Anion Gap: 13 mmol/L (ref 2–17)
BUN: 32 mg/dL — ABNORMAL HIGH (ref 8–23)
CO2: 22 mmol/L (ref 22–29)
Calcium: 9.2 mg/dL (ref 8.5–10.7)
Chloride: 105 mmol/L (ref 98–107)
Creatinine: 1.6 mg/dL — ABNORMAL HIGH (ref 0.5–1.0)
Est, Glom Filt Rate: 31 mL/min/1.73mÂ² — ABNORMAL LOW (ref >=60)
Globulin: 3 g/dL (ref 1.9–4.4)
Glucose: 129 mg/dL — ABNORMAL HIGH (ref 70–99)
Osmolaliy Calculated: 288 mosm/kg — ABNORMAL HIGH (ref 270–287)
Potassium: 4.7 mmol/L (ref 3.5–5.3)
Sodium: 140 mmol/L (ref 135–145)
Total Bilirubin: 0.24 mg/dL (ref 0.00–1.20)
Total Protein: 7.2 g/dL (ref 5.7–8.3)

## 2023-11-30 LAB — URINALYSIS W/ RFLX MICROSCOPIC
Bilirubin, Urine: NEGATIVE
Blood, Urine: NEGATIVE
Glucose, Ur: NEGATIVE
Ketones, Urine: NEGATIVE
Nitrite, Urine: NEGATIVE
Specific Gravity, UA: 1.02 (ref 1.003–1.035)
Urobilinogen, Urine: 0.2 U/dL (ref >1.0)
pH, Urine: 6 (ref 4.5–8.0)

## 2023-11-30 LAB — MICROSCOPIC URINALYSIS

## 2023-11-30 LAB — MAGNESIUM: Magnesium: 2.3 mg/dL (ref 1.6–2.6)

## 2023-11-30 LAB — LIPASE: Lipase: 56 U/L (ref 13–60)

## 2023-11-30 MED ORDER — IOPAMIDOL 61 % IV SOLN
61 | Freq: Once | INTRAVENOUS | Status: DC | PRN
Start: 2023-11-30 — End: 2023-11-30

## 2023-11-30 MED FILL — ISOVUE-300 61 % IV SOLN: 61 % | INTRAVENOUS | Qty: 100

## 2023-11-30 NOTE — ED Provider Notes (Signed)
 [facility]  EMERGENCY DEPARTMENT ENCOUNTER      Pt Name: April Mays  MRN: [id number]  Birthdate 26-May-1936  Date of evaluation: 11/30/2023  Provider: Eugenio Hoes, MD    CHIEF COMPLAINT       Chief Complaint   Patient presents wi

## 2023-12-01 LAB — EKG 12-LEAD
P Axis: 69 degrees
P-R Interval: 140 ms
Q-T Interval: 394 ms
QRS Duration: 86 ms
QTc Calculation (Bazett): 412 ms
R Axis: 77 degrees
T Axis: 101 degrees
Ventricular Rate: 68 {beats}/min

## 2023-12-01 MED ORDER — AMLODIPINE BESYLATE 5 MG PO TABS
5 | ORAL_TABLET | Freq: Every day | ORAL | 0 refills | 30.00000 days | Status: DC
Start: 2023-12-01 — End: 2024-04-24

## 2023-12-01 MED ORDER — POLYETHYLENE GLYCOL 3350 17 GM/SCOOP PO POWD
17 | Freq: Every day | ORAL | 0 refills | Status: AC
Start: 2023-12-01 — End: 2023-12-30

## 2024-02-24 NOTE — Telephone Encounter (Signed)
 Error

## 2024-02-24 NOTE — Telephone Encounter (Signed)
 [facility] - [address]  [address]  [place] [address]  [phone]      02/24/24      Giorgia Wahler  [address]  [place] [address]      Dear Harriet Pho    Your health and well-being are important to [place]. Our records indicate you are a patient of [facility], and it has been 2.5 years since we last saw you. We understand circumstances may prevent you from visiting your primary care provider (PCP). However, to receive the best care, it is important to be seen by your PCP regularly.    To ensure you remain an active patient with our practice, we ask you to schedule an appointment via MyChart or contact our office at [phone]. Please know that your PCP of record may no longer be with the practice. In this instance, we will do our best to schedule you with another provider.     If we do not hear from you within 6 months from the date of this letter, we will assume you have established care elsewhere and will no longer consider you an active patient of our practice.     Thank you for choosing [facility] and partnering with [place] to provide you with the exceptional care you have come to rely on. We look forward to hearing from you soon!

## 2024-03-27 ENCOUNTER — Inpatient Hospital Stay: Admit: 2024-03-27 | Discharge: 2024-03-27 | Payer: MEDICARE

## 2024-04-24 ENCOUNTER — Inpatient Hospital Stay: Admit: 2024-04-24 | Discharge: 2024-04-25 | Disposition: A | Discharge status: Home / Self Care | Payer: MEDICARE

## 2024-04-24 DIAGNOSIS — I10 Essential (primary) hypertension: Secondary | ICD-10-CM

## 2024-04-24 LAB — CBC WITH AUTO DIFFERENTIAL
Basophils %: 0.5 % (ref 0.0–2.0)
Basophils Absolute: 0 10*3/uL (ref 0.0–0.2)
Eosinophils %: 6.5 % (ref 0.0–7.0)
Eosinophils Absolute: 0.4 10*3/uL (ref 0.0–0.5)
Hematocrit: 35.2 % (ref 34.0–47.0)
Hemoglobin: 11.5 g/dL (ref 11.5–15.7)
Immature Grans (Abs): 0.02 10*3/uL (ref 0.00–0.06)
Immature Granulocytes %: 0.4 % (ref 0.0–0.6)
Lymphocytes Absolute: 1.2 10*3/uL (ref 1.0–3.2)
Lymphocytes: 21 % (ref 15.0–45.0)
MCH: 25.9 pg — ABNORMAL LOW (ref 27.0–34.5)
MCHC: 32.7 g/dL (ref 30.0–36.0)
MCV: 79.3 fL — ABNORMAL LOW (ref 81.0–99.0)
MPV: 10.2 fL (ref 7.0–12.2)
Monocytes %: 8.2 % (ref 4.0–12.0)
Monocytes Absolute: 0.5 10*3/uL (ref 0.3–1.0)
Neutrophils %: 63.4 % (ref 42.0–74.0)
Neutrophils Absolute: 3.6 10*3/uL (ref 1.6–7.3)
Platelets: 172 10*3/uL (ref 140–440)
RBC: 4.44 x10e6/mcL (ref 3.60–5.20)
RDW: 14.6 % (ref 10.0–17.0)
WBC: 5.7 10*3/uL (ref 3.8–10.6)

## 2024-04-24 LAB — BASIC METABOLIC PANEL W/ REFLEX TO MG FOR LOW K
Anion Gap: 13 mmol/L (ref 2–17)
BUN: 18 mg/dL (ref 8–23)
CO2: 22 mmol/L (ref 22–29)
Calcium: 8.9 mg/dL (ref 8.5–10.7)
Chloride: 105 mmol/L (ref 98–107)
Creatinine: 1.5 mg/dL — ABNORMAL HIGH (ref 0.5–1.0)
Est, Glom Filt Rate: 34 mL/min/1.73mÂ² — ABNORMAL LOW (ref >=60)
Glucose: 122 mg/dL — ABNORMAL HIGH (ref 70–99)
Osmolaliy Calculated: 281 mosm/kg (ref 270–287)
Potassium: 3.9 mmol/L (ref 3.5–5.3)
Sodium: 139 mmol/L (ref 135–145)

## 2024-04-24 LAB — TROPONIN: Troponin, High Sensitivity: 19 ng/L — ABNORMAL HIGH (ref 0–14)

## 2024-04-24 MED ORDER — AMLODIPINE BESYLATE 5 MG PO TABS
5 | Freq: Every day | ORAL | Status: DC
Start: 2024-04-24 — End: 2024-04-24
  Administered 2024-04-24: 23:00:00 5 mg via ORAL

## 2024-04-24 MED FILL — AMLODIPINE BESYLATE 5 MG PO TABS: 5 MG | ORAL | Qty: 1 | Fill #0

## 2024-04-24 NOTE — ED Provider Notes (Cosign Needed)
 [facility] EMERGENCY DEPARTMENT  EMERGENCY DEPARTMENT ENCOUNTER      Pt Name: April Mays  MRN: [id number]  Birthdate 1936-11-20  Date of evaluation: 04/24/2024  Provider: Nelda Balsam, PA    CHIEF COMPLAINT       Chief Complaint   Patient presents with    Hypertension     Patient arrives to the ED for a BP with a systolic in the 190s at home. Has been off of BP meds for 3 days due to being in between PCP's. Patient denies CP, SOB, HA, or numbness.      HISTORY OF PRESENT ILLNESS    April Mays is a 88 y.o. female who presents to the emergency department for evaluation of elevated blood pressure.  Patient typically takes amlodipine  5 mg once a day but states that she ran out of it within the last week.  States that she is between primary care providers.  She states that over the past 3 days has not taken her blood pressure medication her systolic has been more elevated and ranging in the 180s.  Denies chest pain, shortness of breath, headache, numbness and edema.  She does endorse ongoing constipation for which she occasionally takes a laxative but does not really have any stomach pain and has no nausea vomiting diarrhea or obstipation.  She states she has appointment scheduled with primary care provider on June 1.      Nursing Notes were reviewed.  REVIEW OF SYSTEMS       ROS as documented in HPI unless otherwise stated below.          PHYSICAL EXAM       ED Triage Vitals [04/24/24 1749]   BP Systolic BP Percentile Diastolic BP Percentile Temp Temp Source Pulse Respirations SpO2   (!) 188/76 -- -- 98 F (36.7 C) Oral 70 19 100 %      Height Weight - Scale         1.651 m (5\' 5" ) 77.1 kg (170 lb)             General: Alert, NAD  Head: NCAT  Eye: Normal conjunctiva, PERRLA, EOMI.  Neck: Supple, nontender  Cardiovascular: RRR, 2+ radial pulses, no pitting edema.  Respiratory: Normal WOB  Gastrointestinal: Soft, NT/ND   Musculoskeletal: No gross  deformities  Neurologic: Alert, normal speech, moves all extremities, ambulatory here, 5 out of 5 muscle strength to the upper and lower extremities.  Skin: No rashes, no skin breakdown  Psychiatric: Cooperative with appropriate mood and affect    DIAGNOSTIC RESULTS   PROCEDURES:  Unless otherwise noted below, none     Procedures  RADIOLOGY:   Non-plain film images such as CT, Ultrasound and MRI are read by the radiologist. Plain radiographic images are visualized and preliminarily interpreted by the emergency physician with the below findings:    Interpretation per the Radiologist below, if available at the time of this note:    No orders to display       LABS:  Labs Reviewed   TROPONIN - Abnormal; Notable for the following components:       Result Value    Troponin, High Sensitivity 18 (*)     All other components within normal limits   CBC WITH AUTO DIFFERENTIAL - Abnormal; Notable for the following components:    MCV 79.3 (*)     MCH 25.9 (*)     All other components within normal limits  BASIC METABOLIC PANEL W/ REFLEX TO MG FOR LOW K - Abnormal; Notable for the following components:    Glucose 122 (*)     Creatinine 1.5 (*)     Est, Glom Filt Rate 34 (*)     All other components within normal limits   TROPONIN - Abnormal; Notable for the following components:    Troponin, High Sensitivity 19 (*)     All other components within normal limits       All other labs were within normal range or not returned as of this dictation.  EMERGENCY DEPARTMENT COURSE/REASSESSMENT and MDM:   Vitals:    Vitals:    04/24/24 1845 04/24/24 1911 04/24/24 1930 04/24/24 2000   BP: (!) 190/65 (!) 166/71 (!) 179/76 (!) 181/75   Pulse: 54  58 67   Resp: 16  16 14    Temp:       TempSrc:       SpO2:    100%   Weight:       Height:           ED Course:       Medical Decision Making  Patient presents for evaluation of asymptomatic hypertension.  We did check some labs on her to screen for evidence of endorgan damage.  Fortunately  creatinine is at her baseline as is her GFR.  Her troponin initially was 19 with a flat delta.  We administered 1 dose of her Norvasc  here and refilled her x 30 days.  She has an appointment scheduled for follow-up with primary care provider on June 1 for recheck of her blood pressure which I advised that she should keep this appointment.  EKG was unchanged from previous with no evidence of acute ischemia at this time.  Did not see evidence of hypertensive urgency or emergency based on workup.  She denied headache and neurologic exam was reassuring.  She will follow-up with outpatient primary care provider as discussed, if anything worsens or changes she can return to ED.    Amount and/or Complexity of Data Reviewed  Labs: ordered.    Risk  OTC drugs.  Prescription drug management.          FINAL IMPRESSION      1. Hypertension, unspecified type    2. Constipation, unspecified constipation type          PATIENT REFERRED TO:  your PCP      as scheduled June 1        DISCHARGE MEDICATIONS:  Discharge Medication List as of 04/24/2024  8:18 PM        START taking these medications    Details   polyethylene glycol (MIRALAX ) 17 g packet Take 1 packet by mouth daily as needed for Constipation, Disp-30 packet, R-0Normal             DISPOSITION/PLAN   DISPOSITION Decision To Discharge 04/24/2024 08:14:48 PM   DISPOSITION CONDITION Stable           PAST MEDICAL HISTORY     Past Medical History:   Diagnosis Date    Anxiety     Bilateral club feet     DR RAVENELL/ FREQUENT Sx AGE 30-8    CAD (coronary artery disease) 01/2012    S/P MI SENT ON 2 OCCASSIONS ([facility])    Depression     Elevated serum creatinine     DUE TO NEPHRECTOMY    History of stress test 01/2012    NORMAL/ DR MANSINDET/REVERSIBLE PERFUSION  DEFECT    Hx of echocardiogram 01/2012    NORMAL/ DR MASINDET    Hyperlipidemia     Hypertension     Neuropathy     Pericarditis     Tx WITH CHOLCHICINE ([place])    Type 2 diabetes mellitus without complication ([facility])         SURGICAL HISTORY       Past Surgical History:   Procedure Laterality Date    CAROTID STENT  2013    @[facility] , REPEAT STUDY AND REPAIR DONE 2013    KIDNEY REMOVAL Left     CONGENTIALLY DEFORMED     CURRENT MEDICATIONS       Discharge Medication List as of 04/24/2024  8:18 PM        CONTINUE these medications which have NOT CHANGED    Details   Simethicone  125 MG CAPS Take 1 capsule by mouth 4 times daily as needed (gas pain), Disp-28 capsule, R-0Normal      lisinopril  (PRINIVIL ;ZESTRIL ) 10 MG tablet TAKE 1 TABLET BY MOUTH EVERY DAY FOR 90 DAYS, Disp-100 tablet, R-4REFILL NEEDEDNormal      ibuprofen (ADVIL;MOTRIN) 800 MG tablet TAKE 1 TABLET BY MOUTH EVERY 4 TO 6 HOURS AS NEEDEDHistorical Med      ondansetron (ZOFRAN-ODT) 4 MG disintegrating tablet TAKE 1 TABLET BY MOUTH EVERY 6 HOURS AS NEEDED FOR NAUSEA AND VOMITINGHistorical Med      triazolam (HALCION) 0.25 MG tablet TAKE 1-2 TABS 1 HOUR BEFORE ARRIVING TO APPT. MAKE SURE TO HAVE SOMEONE DRIVE TO/FROM THE APPT.Historical Med      clopidogrel (PLAVIX) 75 MG tablet Take 75 mg by mouth dailyHistorical Med      rosuvastatin  (CRESTOR ) 20 MG tablet TAKE 1 TABLET BY MOUTH EVERY DAY FOR 90 DAYS, Disp-90 tablet, R-1REFILL NEEDED PLEASENormal      lisinopril -hydroCHLOROthiazide (PRINZIDE;ZESTORETIC) 10-12.5 MG per tablet 1 tabs, Oral, Daily, 30 tabs, 0, 06/16/18 19:58:00 EDT, TabHistorical Med      fluticasone  (FLONASE ) 50 MCG/ACT nasal spray 1 spray by Each Nostril route 2 times daily, Disp-16 g, R-1Normal      azelastine  (ASTELIN ) 0.1 % nasal spray 1 spray by Nasal route 2 times daily Use in each nostril as directed, Disp-30 mL, R-1Normal           ALLERGIES     Penicillins and Penicillin g  FAMILY HISTORY       Family History   Problem Relation Age of Onset    Coronary Art Dis Father         SOCIAL HISTORY       Social History     Socioeconomic History    Marital status: Widowed   Tobacco Use    Smoking status: Never    Smokeless tobacco: Never   Vaping Use    Vaping  status: Never Used   Substance and Sexual Activity    Alcohol use: Never    Drug use: Never             (Please note that portions of this note were completed with a voice recognition program.  Efforts were made to edit the dictations but occasionally words are mis-transcribed.)  Nelda Balsam, PA (electronically signed)  Attending Emergency Physician       Nelda Balsam, PA  04/24/24 2221

## 2024-04-25 LAB — EKG 12-LEAD
P Axis: 57 degrees
P-R Interval: 130 ms
Q-T Interval: 392 ms
QRS Duration: 88 ms
QTc Calculation (Bazett): 404 ms
R Axis: 83 degrees
T Axis: 114 degrees
Ventricular Rate: 65 {beats}/min

## 2024-04-25 LAB — TROPONIN: Troponin, High Sensitivity: 18 ng/L — ABNORMAL HIGH (ref 0–14)

## 2024-04-25 MED ORDER — AMLODIPINE BESYLATE 5 MG PO TABS
5 | ORAL_TABLET | Freq: Every day | ORAL | 0 refills | 30.00000 days | Status: AC
Start: 2024-04-25 — End: 2024-05-24

## 2024-04-25 MED ORDER — AMLODIPINE BESYLATE 5 MG PO TABS
5 | ORAL_TABLET | Freq: Every day | ORAL | 0 refills | 30.00000 days | Status: DC
Start: 2024-04-25 — End: 2024-04-24

## 2024-04-25 MED ORDER — POLYETHYLENE GLYCOL 3350 17 G PO PACK
17 | PACK | Freq: Every day | ORAL | 0 refills | 30.00000 days | Status: AC | PRN
Start: 2024-04-25 — End: 2024-05-24

## 2024-07-01 LAB — COMPREHENSIVE METABOLIC PANEL
ALT: 13 U/L (ref 0–42)
AST: 24 U/L (ref 0–46)
Albumin/Globulin Ratio: 1.5 (ref 1.00–2.70)
Albumin: 4.4 g/dL (ref 3.5–5.2)
Alk Phosphatase: 165 U/L — ABNORMAL HIGH (ref 35–117)
Anion Gap: 14 mmol/L (ref 2–17)
BUN: 19 mg/dL (ref 8–23)
CO2: 20 mmol/L — ABNORMAL LOW (ref 22–29)
Calcium: 9.5 mg/dL (ref 8.5–10.7)
Chloride: 103 mmol/L (ref 98–107)
Creatinine: 1.6 mg/dL — ABNORMAL HIGH (ref 0.5–1.0)
Est, Glom Filt Rate: 31 mL/min/1.73m — ABNORMAL LOW (ref >=60)
Globulin: 2.9 g/dL (ref 1.9–4.4)
Glucose: 112 mg/dL — ABNORMAL HIGH (ref 70–99)
Osmolaliy Calculated: 277 mosm/kg (ref 270–287)
Potassium: 4.4 mmol/L (ref 3.5–5.3)
Sodium: 137 mmol/L (ref 135–145)
Total Bilirubin: 0.46 mg/dL (ref 0.00–1.20)
Total Protein: 7.3 g/dL (ref 5.7–8.3)

## 2024-07-01 LAB — CBC WITH AUTO DIFFERENTIAL
Basophils %: 0.6 % (ref 0.0–2.0)
Basophils Absolute: 0 x10e3/mcL (ref 0.0–0.2)
Eosinophils %: 5.6 % (ref 0.0–7.0)
Eosinophils Absolute: 0.3 x10e3/mcL (ref 0.0–0.5)
Hematocrit: 38 % (ref 34.0–47.0)
Hemoglobin: 12.5 g/dL (ref 11.5–15.7)
Immature Grans (Abs): 0.01 x10e3/mcL (ref 0.00–0.06)
Immature Granulocytes %: 0.2 % (ref 0.0–0.6)
Lymphocytes Absolute: 1.2 x10e3/mcL (ref 1.0–3.2)
Lymphocytes: 25.1 % (ref 15.0–45.0)
MCH: 25.9 pg — ABNORMAL LOW (ref 27.0–34.5)
MCHC: 32.9 g/dL (ref 30.0–36.0)
MCV: 78.8 fL — ABNORMAL LOW (ref 81.0–99.0)
MPV: 10.1 fL (ref 7.0–12.2)
Monocytes %: 7.5 % (ref 4.0–12.0)
Monocytes Absolute: 0.4 x10e3/mcL (ref 0.3–1.0)
NRBC Absolute: 0 x10e3/mcL (ref 0.000–0.012)
NRBC Automated: 0 % (ref 0.0–0.2)
Neutrophils %: 61 % (ref 42.0–74.0)
Neutrophils Absolute: 2.9 x10e3/mcL (ref 1.6–7.3)
Platelets: 134 x10e3/mcL — ABNORMAL LOW (ref 140–440)
RBC: 4.82 x10e6/mcL (ref 3.60–5.20)
RDW: 14.8 % (ref 10.0–17.0)
WBC: 4.7 x10e3/mcL (ref 3.8–10.6)

## 2024-07-01 LAB — LIPASE: Lipase: 54 U/L (ref 13–60)
